# Patient Record
Sex: Male | Born: 1995 | Hispanic: Yes | Marital: Single
Health system: Midwestern US, Academic
[De-identification: ages and names within clinical notes are randomized; demographics above are authoritative.]

---

## 2019-02-21 ENCOUNTER — Encounter: Admit: 2019-02-21 | Discharge: 2019-02-21 | Payer: BC Managed Care – PPO

## 2019-02-21 ENCOUNTER — Encounter
Admit: 2019-02-21 | Discharge: 2019-02-21 | Payer: BC Managed Care – PPO | Primary: Student in an Organized Health Care Education/Training Program

## 2019-02-21 ENCOUNTER — Ambulatory Visit: Admit: 2019-02-21 | Discharge: 2019-02-22 | Payer: BC Managed Care – PPO

## 2019-02-21 NOTE — Telephone Encounter
02/21/19- received back from [facility] - they can not find this patient in their system- no records to send.  Thanks,  Ascencion Dike  [facility] Medical Records Specialist  [phone]

## 2019-02-21 NOTE — Progress Notes
Company:[employer] ( [employer])    Type: Looping    Duration: 30 DAYS    Ordering Provider: Vivianne Spence    Diagnons: PALPITATIONS    Online Enrollment Completed: Yes

## 2019-02-21 NOTE — Telephone Encounter
Pt's name was incorrect in our system. Correct last name was entered. New request faxed to [facility] for records at 4:35pm on 02/21/19.

## 2019-02-21 NOTE — Patient Instructions
We would like to follow up with you after testing is completed.     A heart rhythm monitor will be sent to your home with detailed instructions.     Please schedule a stress test. You can call [phone] to schedule.     Please call us with questions, [phone], Shawna Orleans RN or Cicero Duck RN     Please allow 5-7 business days on all results. All normal results will go to MyChart. If you do not have Mychart, it is strongly recommended to get this so you can easily view all your results. If you do not have mychart, we will attempt to call you once with normal lab and testing results. If we cannot reach you by phone with normal results, we will send you a letter. We will always call you with abnormal lab/test results.

## 2019-02-22 ENCOUNTER — Encounter: Admit: 2019-02-22 | Discharge: 2019-02-22 | Payer: BC Managed Care – PPO

## 2019-02-22 ENCOUNTER — Encounter
Admit: 2019-02-22 | Discharge: 2019-02-22 | Payer: BC Managed Care – PPO | Primary: Student in an Organized Health Care Education/Training Program

## 2019-02-22 DIAGNOSIS — R002 Palpitations: Principal | ICD-10-CM

## 2019-02-22 DIAGNOSIS — R55 Syncope and collapse: ICD-10-CM

## 2019-02-22 NOTE — Telephone Encounter
Pt with dobutamine echo scheduled for 5/14 at 0830.  Spoke with pt regarding npo status, covid-19 screen, wear a mask, car check-in, waiting room policy.  Verbalizes understanding.

## 2019-02-23 ENCOUNTER — Encounter: Admit: 2019-02-23 | Discharge: 2019-02-23 | Payer: BC Managed Care – PPO

## 2019-02-23 ENCOUNTER — Ambulatory Visit
Admit: 2019-02-23 | Discharge: 2019-02-23 | Payer: BC Managed Care – PPO | Primary: Student in an Organized Health Care Education/Training Program

## 2019-02-23 DIAGNOSIS — R002 Palpitations: Principal | ICD-10-CM

## 2019-02-23 DIAGNOSIS — R55 Syncope and collapse: Secondary | ICD-10-CM

## 2019-02-23 MED ORDER — SODIUM CHLORIDE 0.9 % IV SOLP
250 mL | INTRAVENOUS | 0 refills | Status: DC | PRN
Start: 2019-02-23 — End: 2019-02-28

## 2019-02-23 MED ORDER — DOBUTAMINE IN D5W 250 MG/250 ML (1 MG/ML) IV SOLP
40 ug/kg/min | Freq: Once | INTRAVENOUS | 0 refills | Status: CP
Start: 2019-02-23 — End: ?
  Administered 2019-02-23: 15:00:00 3176 ug/min via INTRAVENOUS

## 2019-02-23 MED ORDER — EUCALYPTUS-MENTHOL MM LOZG
1 | Freq: Once | ORAL | 0 refills | Status: AC | PRN
Start: 2019-02-23 — End: ?

## 2019-02-23 MED ORDER — METOPROLOL TARTRATE 5 MG/5 ML IV SOLN
5 mg | Freq: Once | INTRAVENOUS | 0 refills | Status: AC | PRN
Start: 2019-02-23 — End: ?

## 2019-02-23 MED ORDER — ATROPINE 0.1 MG/ML IJ SYRG
.25 mg | INTRAVENOUS | 0 refills | Status: DC | PRN
Start: 2019-02-23 — End: 2019-02-28
  Administered 2019-02-23: 15:00:00 0.25 mg via INTRAVENOUS

## 2019-02-24 ENCOUNTER — Encounter
Admit: 2019-02-24 | Discharge: 2019-02-24 | Payer: BC Managed Care – PPO | Primary: Student in an Organized Health Care Education/Training Program

## 2019-02-27 ENCOUNTER — Encounter
Admit: 2019-02-27 | Discharge: 2019-02-27 | Payer: BC Managed Care – PPO | Primary: Student in an Organized Health Care Education/Training Program

## 2019-02-27 DIAGNOSIS — R002 Palpitations: Principal | ICD-10-CM

## 2019-02-27 DIAGNOSIS — I472 Ventricular tachycardia: ICD-10-CM

## 2019-02-27 NOTE — Telephone Encounter
Fax received. Will have REG review. SR with VT 7 beasts at 134bpm.

## 2019-02-28 ENCOUNTER — Encounter: Admit: 2019-02-28 | Discharge: 2019-02-28 | Payer: BC Managed Care – PPO

## 2019-02-28 ENCOUNTER — Encounter
Admit: 2019-02-28 | Discharge: 2019-02-28 | Payer: BC Managed Care – PPO | Primary: Student in an Organized Health Care Education/Training Program

## 2019-02-28 DIAGNOSIS — R55 Syncope and collapse: Principal | ICD-10-CM

## 2019-03-01 ENCOUNTER — Encounter
Admit: 2019-03-01 | Discharge: 2019-03-01 | Payer: BC Managed Care – PPO | Primary: Student in an Organized Health Care Education/Training Program

## 2019-03-01 ENCOUNTER — Ambulatory Visit: Admit: 2019-03-01 | Discharge: 2019-03-02 | Payer: BC Managed Care – PPO

## 2019-03-01 ENCOUNTER — Encounter: Admit: 2019-03-01 | Discharge: 2019-03-01 | Payer: BC Managed Care – PPO

## 2019-03-01 ENCOUNTER — Ambulatory Visit
Admit: 2019-03-01 | Discharge: 2019-03-01 | Payer: BC Managed Care – PPO | Primary: Student in an Organized Health Care Education/Training Program

## 2019-03-01 DIAGNOSIS — R55 Syncope and collapse: Principal | ICD-10-CM

## 2019-03-01 LAB — COMPREHENSIVE METABOLIC PANEL
Lab: 0.7 mg/dL (ref 0.3–1.2)
Lab: 1 mg/dL (ref 0.4–1.24)
Lab: 10 K/UL (ref 3–12)
Lab: 10 mg/dL (ref 8.5–10.6)
Lab: 12 U/L (ref 7–56)
Lab: 13 U/L (ref 7–40)
Lab: 141 MMOL/L — ABNORMAL HIGH (ref 137–147)
Lab: 27 MMOL/L (ref 21–30)
Lab: 4.3 MMOL/L (ref 3.5–5.1)
Lab: 5.2 g/dL — ABNORMAL HIGH (ref 3.5–5.0)
Lab: 70 U/L (ref 25–110)
Lab: 8.1 g/dL — ABNORMAL HIGH (ref 6.0–8.0)
Lab: 87 mg/dL (ref 70–100)
Lab: >60 mL/min (ref >60)
Lab: >60 mL/min (ref >60)

## 2019-03-01 LAB — CBC AND DIFF
Lab: 0 10*3/uL (ref 0–0.20)
Lab: 3.8 10*3/uL — ABNORMAL LOW (ref 4.5–11.0)
Lab: 5.3 M/UL (ref 4.4–5.5)

## 2019-03-01 LAB — MAGNESIUM: Lab: 2.1 mg/dL (ref 1.6–2.6)

## 2019-03-01 LAB — BNP (B-TYPE NATRIURETIC PEPTI): Lab: 12 pg/mL (ref 0–100)

## 2019-03-01 LAB — TSH WITH FREE T4 REFLEX: Lab: 2.5 uU/mL (ref 0.35–5.00)

## 2019-03-01 MED ORDER — METOPROLOL TARTRATE 25 MG PO TAB
12.5 mg | ORAL_TABLET | Freq: Two times a day (BID) | ORAL | 11 refills | 90.00000 days | Status: DC
Start: 2019-03-01 — End: 2019-05-31

## 2019-03-02 ENCOUNTER — Encounter
Admit: 2019-03-02 | Discharge: 2019-03-02 | Payer: BC Managed Care – PPO | Primary: Student in an Organized Health Care Education/Training Program

## 2019-03-02 DIAGNOSIS — I472 Ventricular tachycardia: ICD-10-CM

## 2019-03-02 DIAGNOSIS — R002 Palpitations: Principal | ICD-10-CM

## 2019-03-09 ENCOUNTER — Encounter
Admit: 2019-03-09 | Discharge: 2019-03-09 | Payer: BC Managed Care – PPO | Primary: Student in an Organized Health Care Education/Training Program

## 2019-03-09 ENCOUNTER — Ambulatory Visit: Admit: 2019-03-09 | Discharge: 2019-03-09 | Payer: BC Managed Care – PPO

## 2019-03-09 DIAGNOSIS — R002 Palpitations: Principal | ICD-10-CM

## 2019-03-09 DIAGNOSIS — I472 Ventricular tachycardia: ICD-10-CM

## 2019-03-09 MED ORDER — SODIUM CHLORIDE 0.9 % IJ SOLN
25 mL | Freq: Once | INTRAVENOUS | 0 refills | Status: CP
Start: 2019-03-09 — End: ?
  Administered 2019-03-09: 15:00:00 25 mL via INTRAVENOUS

## 2019-03-09 MED ORDER — GADOBENATE DIMEGLUMINE 529 MG/ML (0.1MMOL/0.2ML) IV SOLN
15 mL | Freq: Once | INTRAVENOUS | 0 refills | Status: CP
Start: 2019-03-09 — End: ?
  Administered 2019-03-09: 15:00:00 15 mL via INTRAVENOUS

## 2019-03-10 ENCOUNTER — Encounter
Admit: 2019-03-10 | Discharge: 2019-03-10 | Payer: BC Managed Care – PPO | Primary: Student in an Organized Health Care Education/Training Program

## 2019-03-12 ENCOUNTER — Emergency Department
Admit: 2019-03-12 | Discharge: 2019-03-12 | Payer: BC Managed Care – PPO | Attending: Student in an Organized Health Care Education/Training Program

## 2019-03-12 ENCOUNTER — Encounter
Admit: 2019-03-12 | Discharge: 2019-03-12 | Payer: BC Managed Care – PPO | Primary: Student in an Organized Health Care Education/Training Program

## 2019-03-12 ENCOUNTER — Emergency Department
Admit: 2019-03-12 | Discharge: 2019-03-13 | Disposition: A | Discharge status: Home / Self Care | Payer: BC Managed Care – PPO | Attending: Student in an Organized Health Care Education/Training Program

## 2019-03-12 DIAGNOSIS — R55 Syncope and collapse: Principal | ICD-10-CM

## 2019-03-12 MED ORDER — ASPIRIN 81 MG PO CHEW
324 mg | Freq: Once | ORAL | 0 refills | Status: CP
Start: 2019-03-12 — End: ?
  Administered 2019-03-13: 324 mg via ORAL

## 2019-03-12 MED ORDER — FAMOTIDINE (PF) 20 MG/2 ML IV SOLN
20 mg | Freq: Once | INTRAVENOUS | 0 refills | Status: CP
Start: 2019-03-12 — End: ?
  Administered 2019-03-13: 20 mg via INTRAVENOUS

## 2019-03-12 MED ORDER — FAMOTIDINE 20 MG PO TAB
20 mg | ORAL_TABLET | Freq: Two times a day (BID) | ORAL | 0 refills | 90.00000 days | Status: DC
Start: 2019-03-12 — End: 2019-06-12

## 2019-03-12 MED ORDER — ACETAMINOPHEN/LIDOCAINE/ANTACID DS(#) 1:1:3  PO SUSP
30 mL | Freq: Once | ORAL | 0 refills | Status: CP
Start: 2019-03-12 — End: ?
  Administered 2019-03-13: 30 mL via ORAL

## 2019-03-12 NOTE — ED Notes
Jason Elliott is a 23 y.o male presenting to ED 17 with CC of chest pain for over a month. Pt reports has been seen for his CP in the past and is currently wearing a holter monitor. [name] has a past medical history of Near syncope (02/28/2019).  Patient is alert and oriented x4. Follows commands. Moves all extremities equally. Telemetry, bp, and SPO2 monitor applied. Afebrile. Patient is sinus rhythm. BP WNL. Palpable pulses. SPO2 WNL on room air. Lungs clear to auscultation. Eupneic. Skin warm/dry/intact. Call light placed within reach. Cart in lowest position, wheels locked, side rails up. Will continue to monitor and await provider evaluation.     All belongings gathered and placed in belonging bag with patient labels at bedside.  The bag(s) contain(s) the following:  Clothing: shirt, shorts, shoes, mask  Other: wallet; phone; watch  All belongings placed in 1 bag(s).  Belongings disposition: all with patient at bedside.

## 2019-03-13 DIAGNOSIS — R079 Chest pain, unspecified: Principal | ICD-10-CM

## 2019-03-13 LAB — COMPREHENSIVE METABOLIC PANEL
Lab: 1.1 mg/dL (ref 0.4–1.24)
Lab: 103 MMOL/L (ref 98–110)
Lab: 13 mg/dL (ref 7–25)
Lab: 140 MMOL/L (ref 137–147)
Lab: 3.8 MMOL/L — ABNORMAL HIGH (ref 3.5–5.1)
Lab: 7.5 g/dL (ref 6.0–8.0)
Lab: 9.5 mg/dL (ref 8.5–10.6)
Lab: 97 mg/dL (ref 70–100)
Lab: >60 mL/min (ref >60)

## 2019-03-13 LAB — POC TROPONIN
Lab: 0 ng/mL (ref 0.00–0.05)
Lab: 0 ng/mL (ref 0.00–0.05)

## 2019-03-13 LAB — CBC AND DIFF
Lab: 0 10*3/uL (ref 0–0.20)
Lab: 0.1 10*3/uL (ref 0–0.45)
Lab: 4.1 10*3/uL — ABNORMAL LOW (ref 4.5–11.0)

## 2019-03-13 NOTE — ED Notes
Pt identified. Pt a&o x 4, breathing even, non-labored. Skin race appropriate, warm and dry. Pt on cardiac monitor, VSS. Bed in lowest locked position, call light within reach.

## 2019-03-13 NOTE — ED Notes
This RN provides discharge teaching and instructions. Pt AAOx4, verbalizes understanding and denies further questions. Pt ambulates off unit with an even, steady gait in no acute distress. All belongings and paperwork in patient possession upon departure.

## 2019-03-14 ENCOUNTER — Encounter: Admit: 2019-03-14 | Discharge: 2019-03-14 | Primary: Student in an Organized Health Care Education/Training Program

## 2019-03-14 ENCOUNTER — Emergency Department: Admit: 2019-03-14 | Discharge: 2019-03-14 | Disposition: A | Discharge status: Home / Self Care

## 2019-03-14 ENCOUNTER — Emergency Department: Admit: 2019-03-14 | Discharge: 2019-03-14

## 2019-03-14 DIAGNOSIS — R079 Chest pain, unspecified: Principal | ICD-10-CM

## 2019-03-14 DIAGNOSIS — R55 Syncope and collapse: Secondary | ICD-10-CM

## 2019-03-14 LAB — COMPREHENSIVE METABOLIC PANEL
Lab: 0.9 mg/dL (ref 0.3–1.2)
Lab: 1 mg/dL (ref 0.4–1.24)
Lab: 102 MMOL/L (ref 98–110)
Lab: 12 U/L (ref 7–56)
Lab: 14 U/L (ref 7–40)
Lab: 141 MMOL/L (ref 137–147)
Lab: 22 mg/dL (ref 7–25)
Lab: 29 MMOL/L (ref 21–30)
Lab: 3.9 MMOL/L (ref 3.5–5.1)
Lab: 5.2 g/dL — ABNORMAL HIGH (ref 3.5–5.0)
Lab: 61 U/L (ref 25–110)
Lab: 83 mg/dL (ref 70–100)
Lab: 9.9 mg/dL (ref 8.5–10.6)
Lab: >60 mL/min (ref >60)
Lab: >60 mL/min (ref >60)
Lab: 10 (ref 3–12)

## 2019-03-14 LAB — POC TROPONIN: Lab: 0 ng/mL (ref 0.00–0.05)

## 2019-03-14 LAB — CBC: Lab: 3.9 10*3/uL — ABNORMAL LOW (ref 4.5–11.0)

## 2019-03-14 NOTE — ED Notes
Upon receiving discharge paperwork pt states "my chest isn't really feeling better, I think it might actually be worse. Is there anything I can get for my pain to go home with?" MD notified.

## 2019-03-14 NOTE — Telephone Encounter
Called patient.  LVM for return call.

## 2019-03-14 NOTE — ED Notes
Pt A&O x4, VSS, with no complaints. Pt given discharge instructions. Pt verbalizes understanding of discharge instructions. Pt states no further questions or comments at this point in time.

## 2019-03-14 NOTE — Telephone Encounter
-----   Message from Golda Acre, LPN sent at 10/17/8370 11:14 AM CDT -----  Regarding: RRR- chest pain and SOA  VM from patient on triage line.  He was in our ED on 03-12-19 for chest pain.  They did chest x-ray and EKG and was told that he was fine.  He is having chest pain, a sharp pain across his shoulder and feet are swollen, SOA at times, feels like b/p is high as his cheeks are red and his legs are swollen.  He would like call back at #[phone] and he appreciates any help we can give him.

## 2019-03-17 ENCOUNTER — Encounter: Admit: 2019-03-17 | Discharge: 2019-03-17 | Primary: Student in an Organized Health Care Education/Training Program

## 2019-03-17 ENCOUNTER — Ambulatory Visit: Admit: 2019-03-17 | Discharge: 2019-03-18 | Primary: Student in an Organized Health Care Education/Training Program

## 2019-03-17 DIAGNOSIS — R55 Syncope and collapse: Secondary | ICD-10-CM

## 2019-03-17 DIAGNOSIS — F419 Anxiety disorder, unspecified: Secondary | ICD-10-CM

## 2019-03-17 DIAGNOSIS — I472 Ventricular tachycardia: Secondary | ICD-10-CM

## 2019-03-17 DIAGNOSIS — R002 Palpitations: Secondary | ICD-10-CM

## 2019-03-17 DIAGNOSIS — R0789 Other chest pain: Secondary | ICD-10-CM

## 2019-03-17 MED ORDER — OMEPRAZOLE 20 MG PO CPDR
20 mg | ORAL_CAPSULE | Freq: Every day | ORAL | 0 refills | Status: DC
Start: 2019-03-17 — End: 2019-03-28

## 2019-03-17 NOTE — Progress Notes
Date of Service: 03/17/2019    Jason Cones Jamara Bombara. is a 23 y.o. male.                History of Present Illness:     See Medical History/Course Below  Jason Elliott. is a 23 y.o. man referred by Reubin Milan, MD for evaluation of non sustained VT on event monitor.  No known cardiac history. He was recently seen by my partner Dr. Vivianne Spence on 02/21/2019 at that time he had reported feeling skips and flutters for over a year.  These have become more frequently recently and the symptoms associated with these have become more severe. Cardiac MRI dated 03/09/2019 showed a normal ventricular EF at 66%, normal right ventricular size and function, no significant valvular normalities, and normal chamber sizes.  There is no evidence of inflammation or fibrosis to explain the patient's ventricular arrhythmias.  A dobutamine stress echocardiogram dated 02/23/2019 showed a normal left ventricular systolic function of 60%, no significant valvular abnormalities, no evidence of ischemia.  There were no arrhythmias inducible during the duration of the study.I reviewed the urgent rhythm strip submitted on 02/27/2019 and I am not convinced that the episode of nonsustained ventricular tachycardia is real.  This could be artifact as there is some sort of abrupt termination and transition prior to the last beat of what is thought to be a premature ventricular contraction.  We will need to continue to monitor and see if this recurs throughout the duration of the statin.  His current cardiac work-up is been negative to date.  With nothing to explain why he would be having nonsustained ventricular tachycardia.  He recently presented to the emergency department on 03/12/2019 with symptoms of chest pain x2 weeks.  He had also reported mild nausea and vomiting, lightheadedness, and palpitations that were worse after he had eaten.  Cardiac work-up was negative.  There is no evidence of significant arrhythmias.  He again presented to the emergency department on 03/14/2019 with similar complaints to 03/12/2019.  We were able to obtain the rhythm strips from the event monitor correlating with the patient's episode at 0728 on 03/14/2019 where he reported chest pain and skipped beats these  episodes did not correlate with any specific arrhythmia.  There was no evidence of nonsustained ventricular tachycardia. Additionally, he obtained the rhythm strips from May 29 during an episode of skipped beats lightheadedness and heart racing and this was associated with sinus rhythm one isolated premature ventricular contraction and one isolated premature atrial contraction.Marland Kitchen    He is with no acute complaints today.  He is has dull chest discomfort today. He felt his blood pressure was rising, he was tired, and he felt flushed again. Both episodes were the same when the episodes happened on Wednesday.  He reports that he is having sharp pain in the chest that sharp pain can last seconds to all day.  Stress does play a big part of this. Anxiety, has always been a problem. He has also noticed that with some foods he gets more this. He has had some nausea, vomiting. Certain foods that cause his symptoms such as pizza or soda.  He has pain with tuna and other foods.  The boosts are something that he uses as an alternative when he can't eat due to pain. He has had a lot of dryness in his throat. Sometimes he has some swelling where it is rough to go down. He has had some issues with  food getting stuck in his throats.     He does not report any significant stressors.  He lives with his fianc???e and his relationship is been going okay.  His mother and father live in [place] and he has a close relationship with them as well.  He has a close relationship with his brothers with no specific contacts currently.  He does report that he is not completely satisfied with his current job and has some issues with the fact that he is not doing what he would ultimately like to be.  He denies being depressed although he feels that he is frequently anxious and has always been like this all of his life.  He denies feeling of hurting himself or hurting others.    Impression:   #.  Chest pain: First, I reviewed the results of that 7 beat run of nonsustained ventricular tachycardia dated 02/27/2019 that correlated with the urgent alert from vital.  On my close review I am confident that this is not true ventricular tachycardia.  This is most consistent with artifact as there is an appearance of QRS spikes that can be seen throughout the VT there is also an abrupt termination of the rhythm strip with some sort of interference.  Ultimately I believe this was this was misinterpreted.  I would like to follow-up on the final results of the monitor. As noted above, I reviewed the rhythm strips from the days that he presented to the emergency department and these were consistent with sinus tachycardia and no nonsustained ventricular tachycardia.  There was one premature atrial contraction and one premature ventricular contraction noted. I am having a hard time correlating the patient's chest discomfort with a cardiac etiology.  Description of his symptoms today actually most consistent with a GI etiology potentially an esophageal spasm, and upper epigastric issue, and/or an achalasia potentially.  He has some occasional food stuck sensation in his throat and some dryness in his throat that does cause him discomfort.  He is also having some issues with specific foods that cause him to have more of the pain and discomfort.  I will go ahead and refer him to GI for formal evaluation.  However, I would like for him to establish care with a primary care doctor as he has several generalized issues and may warrant an endocrine evaluation (rule out pheo, carcinoid syndrome) and may need something for a mild depression/anxiety disorder as he reports several symptoms that suggest some component of these.   #.  Palpitations: He has not had any recurrent episodes of palpitations.  The episode he had that initially brought him into see a cardiologist actually occurred before the monitor.  He has not had a recurrent episode while he has been on the monitor.  #.  Anxiety: There is certainly some component of anxiety related to the patient's symptoms.  I  will have him establish care with a primary care provider for these issues.  #.  Lower extremity edema: Vague and nonspecific symptoms regarding his lower extremity edema.  I have not seen any lower extremity edema during his office visit.    Plan:   #.  I will go ahead and refer him  to GI to rule out any esophageal pathology that might be explaining the patient's sharp chest discomfort and his correlation with specific foods in diet.  #.  We will go ahead and start omeprazole 20 mg once daily to see if this helps.  #.  I would  like him to establish care with a primary care physician to help work-up any potential endocrine issues as noted above and to assist with some potential underlying depression/anxiety disorder.  #.  I provided significant reassurance to the patient regarding his negative cardiac work-up.  I will await the results of the monitor and will plan to see him back in 6 months.  If everything continues to be negative at that point I will have him continue to follow-up with his primary care provider.    Return to clinic in 6 months.    Jason Childes, MD  Cardiovascular Electrophysiology    Please call us with any questions or concerns at [phone].    Medication List:     ??? famotidine (PEPCID) 20 mg tablet Take one tablet by mouth twice daily. (Patient taking differently: Take 20 mg by mouth twice daily. Indications: Pt taking once per day)   ??? metoprolol tartrate (LOPRESSOR) 25 mg tablet Take one-half tablet by mouth twice daily. ??? omeprazole DR (PRILOSEC) 20 mg capsule Take one capsule by mouth daily before breakfast.            Social History:     Social History     Socioeconomic History   ??? Marital status: Single     Spouse name: Not on file   ??? Number of children: Not on file   ??? Years of education: Not on file   ??? Highest education level: Not on file   Occupational History   ??? Not on file   Tobacco Use   ??? Smoking status: Never Smoker   ??? Smokeless tobacco: Never Used   Substance and Sexual Activity   ??? Alcohol use: Not Currently     Comment: social   ??? Drug use: Never   ??? Sexual activity: Not on file   Other Topics Concern   ??? Not on file   Social History Narrative   ??? Not on file       Family History:   No family history of premature coronary artery disease or cardiomyopathy.    Pertinent Studies:       Echo Results  (Last 3 results in the past 3 years)    Echo EF LVIDD LA Size IVS LVPW Rest PAP    (02/23/19)  60 (02/23/19)  4.39 (02/23/19)  2.93 (02/23/19)  1.06 (02/23/19)  1.02 (02/23/19)  22          ECG (personal interpretation)   EKG dated 03/17/2019 shows a sinus rhythm at a rate of 69 bpm, PR interval 149 ms, QRS duration 106 ms, QTC of 426 ms.  There are nonspecific T wave abnormalities.  100 EKG shows some premature atrial contractions or sinus arrhythmia.  There is left atrial enlargement.  On limited resources my friend    Vitals:    03/17/19 1113   BP: 122/74   BP Source: Arm, Left Upper   Pulse: 70   SpO2: 98%   Weight: 71.4 kg (157 lb 6.4 oz)   Height: 1.702 m (5' 7)   PainSc: Five     Body mass index is 24.65 kg/m???.     Past Medical History  Patient Active Problem List    Diagnosis Date Noted   ??? Non-cardiac chest pain 03/23/2019   ??? Anxiety 03/23/2019   ??? Nonsustained ventricular tachycardia (HCC) 03/01/2019     03/09/2019 - Cardiac MRI:  Normal left ventricular size, wall thickness with normal calculated left ventricular ejection fraction of ???66%.  Normal right ventricular size and normal right ventricular systolic function.  No clinically significant valvular abnormalities were noted.  Normal biatrial sizes.  On delayed gadolinium enhancement imaging, no late gadolinium enhancement was seen suggestive of ischemic, inflammatory or focal infiltrative disease process.  No evidence of thoracic aortic and aneurysm, coarctation, or dissection.     ??? Near syncope 02/28/2019   ??? Palpitations 02/21/2019     02/23/2019 - Dobutamine Stress Echo:  Left ventricular systolic function is within normal limits.  LVEF 60%.  No significant valvular abnormalities.  No pericardial effusion.  Nonischemic dobutamine stress echocardiogram and electrocardiogram.            ROS  Review of Systems   Constitution: Positive for malaise/fatigue.   HENT: Negative.    Eyes: Negative.    Cardiovascular: Positive for chest pain.   Respiratory: Negative.    Endocrine: Negative.    Hematologic/Lymphatic: Negative.    Skin: Negative.    Musculoskeletal: Negative.    Gastrointestinal: Negative.    Genitourinary: Negative.    Neurological: Negative.    Psychiatric/Behavioral: Negative.    Allergic/Immunologic: Negative.    ???  BP 122/74 (BP Source: Arm, Left Upper)  - Pulse 70  - Ht 1.702 m (5' 7)  - Wt 71.4 kg (157 lb 6.4 oz)  - SpO2 98%  - BMI 24.65 kg/m???   GENERAL APPEARANCE: Patient in no apparent distress.  PSYCH: Flat affect, depressed, soft-spoken  NEURO: Alert and conversant  VESSELS: JVD is not elevated above the sternal notch.  ENT: Moist mucous membranes  CARDIOVASCULAR:   No parasternal lift Regular rhythm and normal rate. S1 and S2 present.  No murmur.  There is no pericardial rub  GI: Abdomen  nondistended No appreciable hepatomegaly in the upright position  RESPIRATORY: Clear breath sounds bilaterally  EXTREMITIES: There was no    lower extremity edema   SKIN: No rash on chest  GAIT: Able to ambulate to the exam table     Problems Addressed Today  Encounter Diagnoses   Name Primary? ??? Near syncope Yes   ??? Anxiety    ??? Palpitations    ??? Nonsustained ventricular tachycardia (HCC)    ??? Non-cardiac chest pain

## 2019-03-28 ENCOUNTER — Encounter: Admit: 2019-03-28 | Discharge: 2019-03-28 | Primary: Student in an Organized Health Care Education/Training Program

## 2019-03-28 DIAGNOSIS — R55 Syncope and collapse: Secondary | ICD-10-CM

## 2019-03-28 MED ORDER — OMEPRAZOLE 20 MG PO CPDR
20 mg | ORAL_CAPSULE | Freq: Every day | ORAL | 0 refills | Status: DC
Start: 2019-03-28 — End: 2019-05-17

## 2019-03-28 NOTE — Telephone Encounter
-----   Message from Paulino Rily, RN sent at 03/17/2019 12:27 PM CDT -----  Regarding: [id number] symptom assessment f/u  Has pt called to update [place] on his symptoms?   Pt started on Omeprazole 03/17/19. Pt instructed to call on day 10 to notify us if symptoms are improving or not with taking Omeprazole. If so then please refill  Omeprazole. Thanks!

## 2019-03-28 NOTE — Telephone Encounter
This RN attempted to contact pt in regards to symptom assessment after starting Omeprazole on 03/17/19. RN also attempted to f/u on GI referral and IM referral. No answer, LVM with call back number.

## 2019-03-29 ENCOUNTER — Encounter: Admit: 2019-03-29 | Discharge: 2019-03-29 | Primary: Student in an Organized Health Care Education/Training Program

## 2019-03-29 ENCOUNTER — Ambulatory Visit: Admit: 2019-03-28 | Discharge: 2019-03-29 | Primary: Student in an Organized Health Care Education/Training Program

## 2019-03-29 DIAGNOSIS — R002 Palpitations: Secondary | ICD-10-CM

## 2019-04-19 ENCOUNTER — Encounter: Admit: 2019-04-19 | Discharge: 2019-04-19 | Primary: Student in an Organized Health Care Education/Training Program

## 2019-04-19 ENCOUNTER — Emergency Department: Admit: 2019-04-19 | Discharge: 2019-04-19 | Disposition: A | Discharge status: Home Health

## 2019-04-19 ENCOUNTER — Emergency Department: Admit: 2019-04-19 | Discharge: 2019-04-19

## 2019-04-19 DIAGNOSIS — R42 Dizziness and giddiness: Secondary | ICD-10-CM

## 2019-04-19 DIAGNOSIS — Z1159 Encounter for screening for other viral diseases: Secondary | ICD-10-CM

## 2019-04-19 DIAGNOSIS — R002 Palpitations: Secondary | ICD-10-CM

## 2019-04-19 DIAGNOSIS — R079 Chest pain, unspecified: Principal | ICD-10-CM

## 2019-04-19 LAB — COMPREHENSIVE METABOLIC PANEL
Lab: 0.8 mg/dL (ref 0.3–1.2)
Lab: 1 mg/dL (ref 0.4–1.24)
Lab: 105 MMOL/L (ref 98–110)
Lab: 14 U/L (ref 7–40)
Lab: 18 mg/dL (ref 7–25)
Lab: 26 MMOL/L (ref 21–30)
Lab: 4 MMOL/L (ref 3.5–5.1)
Lab: 9.6 mg/dL (ref 8.5–10.6)

## 2019-04-19 LAB — CBC AND DIFF
Lab: 0 10*3/uL (ref 0–0.20)
Lab: 0.1 10*3/uL (ref 0–0.45)
Lab: 0.2 K/UL (ref >60)
Lab: 1 % (ref 0–2)
Lab: 1.4 K/UL (ref >60)
Lab: 2 K/UL (ref 1.8–7.0)
Lab: 3.9 10*3/uL — ABNORMAL LOW (ref 4.5–11.0)
Lab: 36 % (ref 24–44)
Lab: 4.7 M/UL (ref 4.4–5.5)
Lab: 53 % (ref 41–77)
Lab: 91 FL (ref 80–100)

## 2019-04-19 LAB — D-DIMER: Lab: <21 ng{FEU}/mL (ref <500)

## 2019-04-19 LAB — MONO SPOT: Lab: NEGATIVE 10*3/uL (ref 150–400)

## 2019-04-19 LAB — CREATINE KINASE-CPK: Lab: 103 U/L (ref 35–232)

## 2019-04-19 LAB — POC TROPONIN: Lab: 0 ng/mL (ref 0.00–0.05)

## 2019-04-19 MED ORDER — HYOSCYAMINE SULFATE 0.125 MG PO TBDI
.125 mg | Freq: Once | SUBLINGUAL | 0 refills | Status: CP
Start: 2019-04-19 — End: ?
  Administered 2019-04-19: 22:00:00 0.125 mg via SUBLINGUAL

## 2019-04-19 MED ORDER — SODIUM CHLORIDE 0.9 % IV SOLP
1000 mL | INTRAVENOUS | 0 refills | Status: CP
Start: 2019-04-19 — End: ?
  Administered 2019-04-19: 20:00:00 1000 mL via INTRAVENOUS

## 2019-04-19 NOTE — ED Notes
Pt presents to the ED today for heart palpitations. Pt was at work today when he had another "episode" and felt his heart fluttering and SOA. Pt states this has been going on for the last few months but he has never been diagnosed with anything. Pt is 100% on RA and HR is 77. Pt states he is in 7/10 pain in his chest that he describes as sore. Pt also states he feels like his right ankle has been swelling up. Pt A&Ox4 resting in bed, respirations even and non-labored. Skin is warm, dry, intact, and appropriate for ethnicity. Pt hooked up to monitors. Bed in lowest locked position, call light within reach.

## 2019-04-19 NOTE — ED Notes
Pt A&Ox4 and given discharge instructions and follow up information. Pt verbalizes understanding and has no further questions or complaints. VSS. Pt ambulated out of ED with steady gait to go home by POV.

## 2019-04-20 ENCOUNTER — Encounter: Admit: 2019-04-20 | Discharge: 2019-04-20 | Primary: Student in an Organized Health Care Education/Training Program

## 2019-04-20 LAB — COVID-19 (SARS-COV-2) PCR

## 2019-04-20 NOTE — Telephone Encounter
Patient returned call to [facility]- patient verified full name and date of birth. Informed patient of NEGATIVE COVID 19 testing. Patient was asked if they were still experiencing any symptoms and responded with chest pain/soreness. Patient was advised to contact PCP, specialist, and/or ordering provider for ongoing symptoms. Patient does not have PCP- provided patient with [place] line phone number. Patient had no further questions.      Crissie Sickles, DNP, RN, [place], [place], CNRN

## 2019-05-17 ENCOUNTER — Encounter: Admit: 2019-05-17 | Discharge: 2019-05-17 | Primary: Student in an Organized Health Care Education/Training Program

## 2019-05-17 ENCOUNTER — Ambulatory Visit: Admit: 2019-05-17 | Discharge: 2019-05-17 | Primary: Student in an Organized Health Care Education/Training Program

## 2019-05-17 DIAGNOSIS — R55 Syncope and collapse: Secondary | ICD-10-CM

## 2019-05-17 DIAGNOSIS — R002 Palpitations: Secondary | ICD-10-CM

## 2019-05-17 DIAGNOSIS — R0789 Other chest pain: Secondary | ICD-10-CM

## 2019-05-17 DIAGNOSIS — R634 Abnormal weight loss: Secondary | ICD-10-CM

## 2019-05-17 DIAGNOSIS — Z1159 Encounter for screening for other viral diseases: Secondary | ICD-10-CM

## 2019-05-17 DIAGNOSIS — Z0000 Encounter for general adult medical examination without abnormal findings: Secondary | ICD-10-CM

## 2019-05-17 DIAGNOSIS — K219 Gastro-esophageal reflux disease without esophagitis: Secondary | ICD-10-CM

## 2019-05-17 MED ORDER — OMEPRAZOLE 20 MG PO CPDR
ORAL_CAPSULE | Freq: Every day | ORAL | 3 refills | Status: DC
Start: 2019-05-17 — End: 2019-05-31

## 2019-05-17 NOTE — Progress Notes
General Medicine Clinic      SUBJECTIVE      History of Present Illness:  23 year old man with a history of palpitations, anxiety who presents to establish care.    He comes to [place] after just being seen in gastroenterology clinic for dysphasia, having been referred to them by cardiology.  His history begins earlier this year May 2020 when he had an episode of what he describes all of his extremities when cold and he felt flushed and sweaty.  He went to [facility] and was subsequently seen in cardiology clinic due to palpitations. Cardiac work-up for his chest pain/palpitations has been extensive, he has seen Drs. Genton (Cardiology) and Derrell Lolling (EP). He has undergone a Cardiac MRI and dobutamine stress echo which were both unremarkable.    He notes that over the past 3 to 4 months he has been having daily night sweats, had a 30 pound weight loss, he has changed his diet somewhat but does not believe it is enough to explain his weight loss, he is also feeling fatigued and generally rundown.  He works in Teaching laboratory technician, he denies being exposed to benzene or any other chemicals however he is not entirely sure of this.  His paternal grandfather had leukemia which she died of, no other family history.  He has not had any new lumps or bumps especially testicular.  He is sexually active with only 1 partner, his fianc???e and has never had a history of sexually transmitted fractions.  He has 2 tattoos one on his left arm and one on his right upper chest which were professionally done.    Previous work-up is notable for a normal d-dimer, negative mono-spot, negative SARS-CoV-2, normal TSH, normal CMP, transient erythrocytosis, persistent leukopenia (~3.9).      Review of Systems   Constitutional: Positive for malaise/fatigue and weight loss. Negative for fever.        Night sweats   HENT: Negative for congestion and sore throat.    Eyes: Negative for pain. Respiratory: Negative for cough and shortness of breath.    Cardiovascular: Negative for chest pain and leg swelling.   Gastrointestinal: Negative for diarrhea, nausea and vomiting.   Genitourinary: Negative for dysuria.   Skin: Negative for rash.   Neurological: Negative for weakness.         Medical History:   Diagnosis Date   ??? Near syncope 02/28/2019       History reviewed. No pertinent surgical history.    History reviewed. No pertinent family history.    Social History     Socioeconomic History   ??? Marital status: Single     Spouse name: Not on file   ??? Number of children: Not on file   ??? Years of education: Not on file   ??? Highest education level: Not on file   Occupational History   ??? Not on file   Tobacco Use   ??? Smoking status: Never Smoker   ??? Smokeless tobacco: Never Used   Substance and Sexual Activity   ??? Alcohol use: Not Currently     Comment: social   ??? Drug use: Never   ??? Sexual activity: Not on file   Other Topics Concern   ??? Not on file   Social History Narrative   ??? Not on file       No Known Allergies      Medications:  ??? famotidine (PEPCID) 20 mg tablet Take one tablet by mouth twice daily. (  Patient taking differently: Take 20 mg by mouth twice daily. Indications: Pt taking once per day)   ??? metoprolol tartrate (LOPRESSOR) 25 mg tablet Take one-half tablet by mouth twice daily.   ??? omeprazole DR (PRILOSEC) 20 mg capsule Take two capsules by mouth daily before breakfast AND one capsule daily before dinner.             OBJECTIVE      Vitals:    05/17/19 0935   BP: 120/78   Pulse: 58   Resp: 16   Temp: 36.7 ???C (98 ???F)   TempSrc: Oral   Weight: 65.3 kg (144 lb)   Height: 170.2 cm (67)   Body mass index is 22.55 kg/m???.      Physical Exam  Constitutional:       General: He is not in acute distress.  Cardiovascular:      Rate and Rhythm: Normal rate and regular rhythm.   Pulmonary:      Effort: Pulmonary effort is normal. No respiratory distress.   Abdominal:      Palpations: Abdomen is soft. Tenderness: There is no abdominal tenderness.   Genitourinary:     Scrotum/Testes: Normal.   Musculoskeletal:      Right lower leg: No edema.      Left lower leg: No edema.   Lymphadenopathy:      Cervical: No cervical adenopathy.   Skin:     General: Skin is warm and dry.      Capillary Refill: Capillary refill takes less than 2 seconds.   Neurological:      General: No focal deficit present.      Mental Status: He is alert and oriented to person, place, and time.         Laboratory:  Persistent leukopenia            ASSESSMENT & PLAN      Problem   Unintentional weight loss & night sweats    Over 3 months lost 30 lbs, possible dietary influence but possibly not enough to explain, possible psychiatric influence (h.o. anxiety, somatic symptoms/pain)  Associated fatigue, daily night sweats  Note persistent leukopenia and dysphagia (recent visit with [facility])  Note monospot neg    CBC, smear, ESR-CRP, CT CAP     Preventative Health Care    HIV and HepC today    Nonsmoker    No EtOH               Patient Instructions   [name], I want to review what we spoke about and the plan moving forward:    Ordered Tests:  ??? CT chest, abdomen, pelvis  ??? Multiple blood tests            Important information:  Scheduling ([phone]) or ([phone])  Nurse [phone])  Clinic Fax [phone])    Emergencies (911)  Urgent after-hour [phone]), ask to page on-call physician        Return in about 2 months (around 07/17/2019).

## 2019-05-17 NOTE — Patient Instructions
1) You will be contacted to by Endoscopy Scheduling for date/time of EGD. Please ensure to follow prep instructions listed below:      *You are required to complete a COVID swab within 72 hours prior to procedure. Further information will be provided at time of scheduling. If you would like to complete locally, please advise RN of facility location to ensure orders are sent and received within a timely manner.     2) Ensure to take PPI medication as discussed and 30 minutes prior to meal.     Please call for any questions. Maralyn Sago, RN [phone].   For up to date information on the COVID-19 virus, visit the [facility] website. [email or web address]  ??? General supportive care during cold and flu season and infection prevention reminders:    o Wash hands often with soap and water for at least 20 seconds   o Cover your mouth and nose   o Social distancing: try to maintain 6 feet between you and other people   o Stay home if sick and symptoms mild or manageable?  ??? If you must be around people wear a mask    ??? If you are having symptoms of a lower respiratory infection (cough, shortness of breath) and/or fever AND either traveled in last 30 days (internationally or to region of exposure) OR known exposure to patient with COVID19:     o Call your primary care provider for questions or health needs.   ??? Tell your doctor about your recent travel and your symptoms     o In a medical emergency, call 911 or go to the nearest emergency room.        EGD (ESOPHAGOGASTRODUODENOSCOPY) PREP  PATIENT NAME:__________________________??? ???Dr. ______________________  Bonita Quin are scheduled for an EGD on:??? ???______________________ ???at??? ???__________________??? ??? ??? ??? ???  You must arrive by:???________________ ???and check in at ADMISSIONS[facility]SURGERY CENTER.    Upper GI endoscopy allows healthcare providers to look directly into the beginning of your gastrointestinal(GI) tract.??? The esophagus, stomach, and duodenum (first part of the small intestine) make up the upper GI tract.???  5 Days Prior to Exam:  2. Check with your prescribing physician for instructions about stopping your blood thinner.Examples of blood thinners are Aleve, Aspirin, Coumadin, Eliquis, Ibuprofen, Naproxen, Plavix, and Xarelto.  3. Do not give yourself a Lovenox injection the morning of the test. Lovenox injections may be taken as usual through the day before your test.  ??????Day of Exam:  2. No solid food after midnight. You may drink clear liquids only up until 4 hours before your scheduled procedure time.After this, you should have nothing by mouth.??? This includes GUM or CANDY.???    ???  ??? 1. Chewing tobacco must be stopped??? 6 hours before your scheduled procedure.  2. If you have an early morning test, take ONLY your essential morning medications (heart, blood pressure, seizure, etc.) with a small sip of water.  3. You will be sedated for the procedure. A responsible adult must drive you home (no Benedetto Goad, taxis, or buses are permitted). If you do not have a driver we will be unable to do the test.  4. You will be here for 3-4 hours from arrival time.  5. You will not be able to return to work the same day.  6. Please bring a list of your current medications and the dosages with you.     The Procedure:  ??? You will lie on the endoscopy table. Usually patients lie on the left  side.  ??? You will be monitored and given oxygen.  ??? You are given sedation (relaxing) medication through an intravenous (IV) line.  ??? The healthcare provider will put the endoscope in your mouth and down your esophagus. It is thinner than most pieces of food that you swallow.??? It will not affect your breathing. The medicine helps keep you from gagging.  ??? Air is inserted to expand your GI tract. It can make you burp.  ??? During the procedure, the healthcare provider can take biopsies (tissue samples), remove abnormalities such as polyps, or treat abnormalities through a variety of devices placed through the endoscope. You will not feel this.  ??? The endoscope carries images of your upper GI tract to a video screen.  ??? An adult must drive you home.

## 2019-05-17 NOTE — Patient Instructions
Mr. Blick, I want to review what we spoke about and the plan moving forward:    Ordered Tests:   CT chest, abdomen, pelvis   Multiple blood tests            Important information:  Scheduling ([phone]) or ([phone])  Nurse [phone])  Clinic Fax [phone])    Emergencies (911)  Urgent after-hour ([phone]), ask to page on-call physician

## 2019-05-17 NOTE — Progress Notes
Date of Service: 05/17/2019    Subjective:             Jason Elliott. is a 23 y.o. male.    History of Present Illness  Patient presents to GI clinic today for chief complaint of chest pain. His chest pain started approximately in April 2020 which has prompted several trips to the ER and cardiac workup. He reports his pain starts at the bottom of his ribs in his epigastrum of his abdomen and radiates up to his neck. Occasionally he will have pain radiate to his b/l shoulders. He also reports he can have the sensation of food sticking in his chest. He notices this sensation with solids more than liquids. Both the chest pain and sticking happen nearly daily at different times of the day. Patient has not found a pattern of timing of the episodes. He also reports symptoms of chills, weakness, diaphoresis, when he has the chest pain. Initially he had nausea and vomiting when symptoms first started. This lasted 2-3 weeks and he has since not had n/v. Since April he has had a 30 lbs weight loss which he attributes to decreased appetite and not eating.  His diet consists mostly of tuna and chicken in different variations, ex) sandwhiches or plain. He does not smoke, drink alcohol, use drugs. He only takes omega-3 fish oil OTC. Denies NSAID or tetracycline use. He trailed famotidine for 2 weeks at the start of sypmtoms followed by 2 weeks of omeprazole which he self d/c. He did not notice a difference when taking the medications.     He has been seen extensively in cardiology clinic and workup has included cardiac MRI, stress test, event monitor which has revealed infrequent NSVT. Each of his multiple ED visits, he has had normal EKG and negative troponin.     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       Medical History:   Diagnosis Date   ??? Near syncope 02/28/2019     No past surgical history on file. Social History     Tobacco Use   ??? Smoking status: Never Smoker   ??? Smokeless tobacco: Never Used   Substance Use Topics   ??? Alcohol use: Not Currently     Comment: social   ??? Drug use: Never     FamHx: Noncontributory    Review of Systems   Constitutional: Positive for appetite change, chills, fatigue and unexpected weight change.   HENT: Positive for sinus pressure and trouble swallowing.    Eyes: Negative.    Respiratory: Positive for cough, choking, chest tightness, shortness of breath and wheezing.    Cardiovascular: Positive for palpitations and leg swelling.   Gastrointestinal: Positive for constipation and diarrhea.   Endocrine: Negative.    Genitourinary: Negative.    Musculoskeletal: Positive for arthralgias and myalgias.   Skin: Negative.    Allergic/Immunologic: Negative.    Neurological: Positive for dizziness, weakness, light-headedness, numbness and headaches.   Hematological: Negative.    Psychiatric/Behavioral: Positive for decreased concentration and sleep disturbance. The patient is nervous/anxious.          Objective:         ??? famotidine (PEPCID) 20 mg tablet Take one tablet by mouth twice daily. (Patient taking differently: Take 20 mg by mouth twice daily. Indications: Pt taking once per day)   ???  metoprolol tartrate (LOPRESSOR) 25 mg tablet Take one-half tablet by mouth twice daily.   ??? omeprazole DR (PRILOSEC) 20 mg capsule TAKE ONE CAPSULE BY MOUTH DAILY BEFORE BREAKFAST     Vitals:    05/17/19 0805   BP: 136/77   BP Source: Arm, Left Upper   Patient Position: Sitting   Pulse: 69   Temp: 36.7 ???C (98 ???F)   TempSrc: Oral   Weight: 65.6 kg (144 lb 9.6 oz)   Height: 170.2 cm (67.01)   PainSc: Eight     Body mass index is 22.64 kg/m???.     Physical Exam  Constitutional:       Appearance: Normal appearance.   HENT:      Head: Normocephalic and atraumatic.   Eyes:      Extraocular Movements: Extraocular movements intact.      Conjunctiva/sclera: Conjunctivae normal.   Neck: Musculoskeletal: Normal range of motion and neck supple.   Cardiovascular:      Rate and Rhythm: Normal rate and regular rhythm.      Heart sounds: Normal heart sounds.   Pulmonary:      Effort: Pulmonary effort is normal. No respiratory distress.   Abdominal:      General: Abdomen is flat. Bowel sounds are normal. There is no distension.      Palpations: Abdomen is soft. There is no mass.      Tenderness: There is no abdominal tenderness. There is no right CVA tenderness, left CVA tenderness, guarding or rebound.      Hernia: No hernia is present.   Lymphadenopathy:      Cervical: No cervical adenopathy.   Skin:     General: Skin is warm and dry.   Neurological:      Mental Status: He is alert and oriented to person, place, and time.              Assessment and Plan:  Jason Elliott is a 23 year old male with no significant PMH who presents to GI clinic for chest pain.    Dysphagia, Weight Loss, Chest pain  -Symptoms started in April 2020  -dysphagia noticed with solids more than liquids.   -30lbs weight loss since April 2020, unintentional  Ddx: esophogeal stricture, PUD, GERD, eosinophilic esophagitis,  Malignancy    PLAN  >Omeprazole 40mg  qAM, 20mg  qPM  >Schedule for EGD    Patient seen and discussed with Dr. Jonathon Jordan, MD  PGY-2 Internal Medicine    ATTESTATION    I personally performed the key portions of the E/M visit, discussed case with resident and concur with resident documentation of history, physical exam, assessment, and treatment plan unless otherwise noted.    Staff name:  Everardo All, MD Date:  05/14/2019

## 2019-05-18 ENCOUNTER — Ambulatory Visit: Admit: 2019-06-05 | Discharge: 2019-06-05 | Primary: Student in an Organized Health Care Education/Training Program

## 2019-05-18 DIAGNOSIS — K219 Gastro-esophageal reflux disease without esophagitis: Secondary | ICD-10-CM

## 2019-05-19 ENCOUNTER — Encounter: Admit: 2019-05-19 | Discharge: 2019-05-19 | Primary: Student in an Organized Health Care Education/Training Program

## 2019-05-19 ENCOUNTER — Ambulatory Visit: Admit: 2019-05-19 | Discharge: 2019-05-19 | Primary: Student in an Organized Health Care Education/Training Program

## 2019-05-19 DIAGNOSIS — R634 Abnormal weight loss: Principal | ICD-10-CM

## 2019-05-19 NOTE — Progress Notes
I performed a history and physical examination of the patient and discussed the management with the resident.  I reviewed the resident's note and agree with the documented findings and plan of care.     Jason Elliott is a 28 you M with no chronic PMHx presenting today to establish care.  He has had multiple recent ER visits with chest pain, palpitations and axiety. He has undergone extensive cardiac evaluation.  Prior to our visit today he was also evaluated by GI and started on PPI.     PMHx, family, social and surgical history reviewed and updated in the chart.    Medications reviewed    Vitals reviewed- BMI 22  Wt Readings from Last 8 Encounters:   05/17/19 65.3 kg (144 lb)   05/17/19 65.6 kg (144 lb 9.6 oz)   04/19/19 65.8 kg (145 lb)   03/17/19 71.4 kg (157 lb 6.4 oz)   03/01/19 74.8 kg (164 lb 12.8 oz)   02/23/19 79.4 kg (175 lb)   02/21/19 79.4 kg (175 lb)   ]    A/P: full assessment and plan per Dr. Samuel Jester.  Patient does present with objective 30 pound weight loss.  When further questions about diet arose patient really does eat a very healthy diet which is new over the past several months.  I do think a portion of his weight loss could be attributed to change in dietary habits. However the degree of change is rapid and he definitely perceives this as unintentional. He has other concerning symptoms of palpitations, dysphagia and night sweats which are are a concerning constellation in the setting of weight loss.  I do believe that CT C/A/P is appropriate to r/o underlying malignancy.  Additionally, patient does have some abnormalities on recent lab evaluations.  We will further evaluate these with peripheral smear and HIV testing.     RTC 1 month or PRN    Azucena Fallen, MD

## 2019-05-21 ENCOUNTER — Encounter: Admit: 2019-05-21 | Discharge: 2019-05-21 | Primary: Student in an Organized Health Care Education/Training Program

## 2019-05-21 DIAGNOSIS — R55 Syncope and collapse: Secondary | ICD-10-CM

## 2019-05-31 ENCOUNTER — Encounter: Admit: 2019-05-31 | Discharge: 2019-05-31 | Primary: Student in an Organized Health Care Education/Training Program

## 2019-05-31 DIAGNOSIS — R002 Palpitations: Secondary | ICD-10-CM

## 2019-05-31 DIAGNOSIS — R55 Syncope and collapse: Secondary | ICD-10-CM

## 2019-05-31 NOTE — Progress Notes
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  -----------------------------------------------------------------------------------  Progress???Notes???  Derrell Lolling,???[place],???MD???(Physician)???????????????Clinical???Cardiac???Electrophysiology,???Internal???Medicine???????????????03/17/19???    Date???of???Service:???03/17/2019    History???of???Present???Illness:  ???  See???Medical???History/Course???Below  Mr.???Shlok???Arnulfo???Slowey???Jr.???is???a???22???y.o.???man???referred???by???Genton,???Randall???E,???MD???for???evaluation???of???non???sustained???VT???on???event???monitor.??????No???known???cardiac???history.???He???was???recently???seen???by???my???partner???Dr.???Genton???on???02/21/2019???at???that???time???he???had???reported???feeling???skips???and???flutters???for???over???a???year.??????These???have???become???more???frequently???recently???and???the???symptoms???associated???with???these???have???become???more???severe.???Cardiac???MRI???dated???03/09/2019???showed???a???normal???ventricular???EF???at???66%,???normal???right???ventricular???size???and???function,???no???significant???valvular???normalities,???and???normal???chamber???sizes.??????There???is???no???evidence???of???inflammation???or???fibrosis???to???explain???the???patient's???ventricular???arrhythmias.??????A???dobutamine???stress???echocardiogram???dated???02/23/2019???showed???a???normal???left???ventricular???systolic???function???of???60%,???no???significant???valvular???abnormalities,???no???evidence???of???ischemia.??????There???were???no???arrhythmias???inducible???during???the???duration???of???the???study.I???reviewed???the???urgent???rhythm???strip???submitted???on???02/27/2019???and???I???am???not???convinced???that???the???episode???of???nonsustained???ventricular???tachycardia???is???real.??????This???could???be???artifact???as???there???is???some???sort???of???abrupt???termination???and???transition???prior???to???the???last???beat???of???what???is???thought???to???be???a???premature???ventricular???contraction.??????We???will???need???to???continue???to???monitor???and???see???if???this???recurs???throughout???the???duration???of???the???statin.??????His???current???cardiac???work-up???is???been???negative???to???date.??????With???nothing???to???explain???why???he???would??? be???having???nonsustained???ventricular???tachycardia.??????He???recently???presented???to???the???emergency???department???on???03/12/2019???with???symptoms???of???chest???pain???x2???weeks.??????He???had???also???reported???mild???nausea???and???vomiting,???lightheadedness,???and???palpitations???that???were???worse???after???he???had???eaten.??????Cardiac???work-up???was???negative.??????There???is???no???evidence???of???significant???arrhythmias.??????He???again???presented???to???the???emergency???department???on???03/14/2019???with???similar???complaints???to???03/12/2019.??????We???were???able???to???obtain???the???rhythm???strips???from???the???event???monitor???correlating???with???the???patient's???episode???at???0728???on???03/14/2019???where???he???reported???chest???pain???and???skipped???beats???these??????episodes???did???not???correlate???with???any???specific???arrhythmia.??????There???was???no???evidence???of???nonsustained???ventricular???tachycardia.???Additionally,???he???obtained???the???rhythm???strips???from???May???29???during???an???episode???of???skipped???beats???lightheadedness???and???heart???racing???and???this???was???associated???with???sinus???rhythm???one???isolated???premature???ventricular???contraction???and???one???isolated???premature???atrial???contraction.Marland Kitchen  ???  He???is???with???no???acute???complaints???today.??????He???is???has???dull???chest???discomfort???today.???He???felt???his???blood???pressure???was???rising,???he???was???tired,???and???he???felt???flushed???again.???Both???episodes???were???the???same???when???the???episodes???happened???on???Wednesday.??????He???reports???that???he???is???having???sharp???pain???in???the???chest???that???sharp???pain???can???last???seconds???to???all???day.??????Stress???does???play???a???big???part???of???this.???Anxiety,???has???always???been???a???problem.???He???has???also???noticed???that???with???some???foods???he???gets???more???this.???He???has???had???some???nausea,???vomiting.???Certain???foods???that???cause???his???symptoms???such???as???pizza???or???soda.??????He???has???pain???with???tuna???and???other???foods.??????The???boosts???are???something???that???he???uses???as???an???alternative???when???he???can't???eat???due???to???pain.???He???has???had???a???lot???of???dryness???in???his???throat.???Sometimes???he???has???some???swelling???where???it???is???rough???to???go???down.???He???has???had???some???issues???with???food???getting???stuck???in???his???throats.???  ???  He???does???not???report???any???significant???stressors.??????He???lives???with???his???fianc???e???an d???his???relationship???is???been???going???okay.??????His???mother???and???father???live???in[place]???and???he???has???a???close???relationship???with???them???as???well.??????He???has???a???close???relationship???with???his???brothers???with???no???specific???contacts???currently.??????He???does???report???that???he???is???not???completely???satisfied???with???his???current???job???and???has???some???issues???with???the???fact???that???he???is???not???doing???what???he???would???ultimately???like???to???be.??????He???denies???being???depressed???although???he???feels???that???he???is???frequently???anxious???and???has???always???been???like???this???all???of???his???life.??????He???denies???feeling???of???hurting???himself???or???hurting???others.  ???    Impression:  #.??????Chest???pain:???First,???I???reviewed???the???results???of???that???7???beat???run???of???nonsustained???ventricular???tachycardia???dated???02/27/2019???that???correlated???with???the???urgent???alert???from???vital.??????On???my???close???review???I???am???confident???that???this???is???not???true???ventricular???tachycardia.??????This???is???most???consistent???with???artifact???as???there???is???an???appearance???of???QRS???spikes???that???can???be???seen???throughout???the???VT???there???is???also???an???abrupt???termination???of???the???rhythm???strip???with???some???sort???of???interference.??????Ultimately???I???believe???this???was???this???was???misinterpreted.??????I???would???like???to???follow-up???on???the???final???results???of???the???monitor.???As???noted???above,???I???reviewed???the???rhythm???strips???from???the???days???that???he???presented???to???the???emergency???department???and???these???were???consistent???with???sinus???tachycardia???and???no???nonsustained???ventricular???tachycardia.??????There???was???one???premature???atrial???contraction???and???one???premature???ventricular???contraction???noted.???I???am???having???a???hard???time???correlating???the???patient's???chest???discomfort???with???a???cardiac???etiology.??????Description???of???his???symptoms???today???actually???most???consistent???with???a???GI???etiology???potentially???an???esophageal???spasm,???and???upper???epigastric???issue,???and/or???an???achalasia???potentially.??????He???has???some???occasional???food???stuck???sensation???in???his???throat???and???some???dryness???in???his???throat???that???does???cause???him???discomfort.??????He???is???also???having???some???issues???with???specific???foods???that???cause???him???to???have???more???of???the???pain???and ???discomfort.??????I???will???go???ahead???and???refer???him???to???GI???for???formal???evaluation.??????However,???I???would???like???for???him???to???establish???care???with???a???primary???care???doctor???as???he???has???several???generalized???issues???and???may???warrant???an???endocrine???evaluation???(rule???out???pheo,???carcinoid???syndrome)???and???may???need???something???for???a???mild???depression/anxiety???disorder???as???he???reports???several???symptoms???that???suggest???some???component???of???these.???  #.??????Palpitations:???He???has???not???had???any???recurrent???episodes???of???palpitations.??????The???episode???he???had???that???initially???brought???him???into???see???a???cardiologist???actually???occurred???before???the???monitor.??????He???has???not???had???a???recurrent???episode???while???he???has???been???on???the???monitor.  #.??????Anxiety:???There???is???certainly???some???component???of???anxiety???related???to???the???patient's???symptoms.??????I??????will???have???him???establish???care???with???a???primary???care???provider???for???these???issues.  #.??????Lower???extremity???edema:???Vague???and???nonspecific???symptoms???regarding???his???lower???extremity???edema.??????I???have???not???seen???any???lower???extremity???edema???during???his???office???visit.??????    Plan:  #.??????I???will???go???ahead???and???refer???him??????to???GI???to???rule???out???any???esophageal???pathology???that???might???be???explaining???the???patient's???sharp???chest???discomfort???and???his???correlation???with???specific???foods???in???diet.  #.??????We???will???go???ahead???and???start???omeprazole???20???mg???once???daily???to???see???if???this???helps.  #.??????I???would???like???him???to???establish???care???with???a???primary???care???physician???to???help???work-up???any???potential???endocrine???issues???as???noted???above???and???to???assist???with???some???potential???underlying???depression/anxiety???disorder.  #.??????I???provided???significant???reassurance???to???the???patient???regarding???his???negative???cardiac???work-up.??????I???will???await???the???results???of???the???monitor???and???will???plan???to???see???him???back???in???6???months.??????If???everything???continues???to???be???negative???at???that???point???I???will???have???him???continue???to???follow-up???with???his???primary???care???provider.    From: Laurian Brim, RN - To: Jerry Caras, MD, [place], Patty  about an hour ago    Yes.??????Ok???to???proceed.??????Thanks. From: Jerry Caras, MD - To: Laurian Brim, RN, [place], Patty  42 minutes ago

## 2019-05-31 NOTE — Pre-Anesthesia Patient Instructions
UPPER GI ENDOSCOPY   INSTRUCTIONS      Jason Elliott, Jason Elliott are scheduled for an EGD on: 06/05/2019  at: 10:45 AM   With Dr. Wende Crease, Mayra Neer, MD  You must arrive by:  9:45 AM     ** Please keep in mind that our surgery schedule is fluid, and affected by multiple factors, so it is possible that your procedure time could change.  We will make every effort to maintain your original procedure time, but there are no guarantees. Thank you for your patience and understanding!      OUR ADDRESS IS [address] [place] [address]  [address] TO [address] AND TURN LEFT.   TAKE A LEFT AT THE FIRST OR SECOND ENTRANCE AND FOLLOW SIGNAGE TO THE AMBULATORY SURGERY CENTER ([address]).    To Reschedule call: [phone]  For Questions call: [phone] or [phone]  ____________________________________________________________    Upper GI endoscopy allows healthcare providers to look directly into the beginning of your gastrointestinal(GI) tract.  The esophagus, stomach, and duodenum (first part of the small intestine) make up the upper GI tract.  _____________________________________________________________________________________________________    **Starting today, hold your Multivitamin & Fish Oil until after your procedure.**    5 DAYS PRIOR:  1. Check with your prescribing physician for instructions about stopping your blood thinner.  Examples of blood thinners are Aleve, Aspirin, Coumadin, Eliquis, Ibuprofen, Naproxen, Plavix and Xarelto.  2. Do not give yourself a Lovenox injection the morning of the test.  Lovenox injections may be taken as usual the day before the test.    DAY OF TEST:  1. You should have nothing to eat after midnight prior to your procedure.  2. You may drink clear liquids up until (4) hours before your scheduled procedure time. After this you should have nothing by mouth. This includes GUM or CANDY.   a. Chewing tobacco must be stopped (6) hours before your scheduled procedure.   b. If you have an early morning test, take ONLY your essential morning medications (heart, blood pressure, seizure, etc.) with a small sip of water.  3. Bathe, brush teeth and gargle the morning of your procedure.   4. Wear comfortable, casual, loose fitting clothing that are easy to get on and off.  5. Please bring your cell phone with you.  6. Take off any jewelry, take out any piercings, and leave all other valuables at home.  7. If you wear glasses or contacts, please bring a case for their safekeeping.   8. You will be sedated for the procedure. A responsible adult must drive you home (no Benedetto Goad, taxis or buses are allowed) and we ask that your ride stay at the facility until your procedure is finished. If you do not have a driver we will be unable to do the test.  9. You will be here for (3-4) hours from arrival time.   10. You will not be able to return to work the same day if you have received sedation.   11. Please bring a list of your current medications and dosages with you.  12. Please bring a copy of your DL and insurance card along with any required copay/deductible    ____________________________________________________________________________________________________________________    KEEPING YOU SAFE    ** THE HEALTH AND SAFETY OF OUR PATIENTS, STAFF AND PHYSICIANS IS OUR TOP PRIORITY. TO REDUCE THE RISK OF POSSIBLE COVID-19 EXPOSURE WE ARE TAKING THE PROPER PRECAUTIONS LISTED BELOW    *  YOU ARE PERMITTED 1 VISITOR IN THE WAITING AREA.    * ALL PATIENTS AND THEIR RESPONSIBLE ADULT (IF ENTERING THE FACILITY) WILL BE SCREENED ON THE DATE OF SERVICE UPON ARRIVAL AND BE REQUIRED TO WEAR A MASK WHILE INSIDE THE FACILITY.     * ALL PATIENTS MUST SELF QUARANTINE AFTER COVID-19 TESTING/SCREENING UNTIL THEIR SCHEDULED PROCEDURE.     * NOTIFY YOUR SURGEON IF YOU HAVE ANY SIGNIFICANT HEALTH STATUS CHANGES OR SHOULD YOU BECOME ILL PRIOR TO SURGERY WITH FEVER (TEMP 100.4 FAHRENHEIT OR GREATER) AND/OR COUGH, DIFFICULTY BREATHING, CHILLS, MUSCLE PAIN, HEADACHE, SORE THROAT OR NEW ONSET LOSS OF TASTE OR SMELL, OR NEW SUSPECTED EXPOSURE TO COVID-19.    ___________________________________________________________________________________    COVID-19 TESTING    You are scheduled for a Covid-19 test at the [facility] location (listed below) on  06/02/2019  at  4:30 PM     * UPON ARRIVAL, FOLLOW SIGNS AND PARK YOUR VEHICLE IN DESIGNATED AREA. PLEASE REMAIN IN YOUR VEHICLE AT ALL TIMES AND CALL PHONE NUMBER LISTED BELOW TO CHECK IN.     * ALL PATIENTS MUST SELF QUARANTINE AFTER COVID-19 TESTING/SCREENING UNTIL THEIR SCHEDULED PROCEDURE.     [facility]  [facility]   [address]   [place], [place] [address]  [phone]   Student Center  [address]  [place], [place] [address]  ([address] of [place] and [place])  [phone]         Pre-Admissions Testing (PAT)  [facility] [place]  [email or web address]  [address]   [place], [place] [address]  [phone] (PAT)  [phone] (PAT)  [email or web address]  An Affiliate of [facility]

## 2019-06-02 ENCOUNTER — Encounter: Admit: 2019-06-02 | Discharge: 2019-06-03 | Primary: Student in an Organized Health Care Education/Training Program

## 2019-06-02 DIAGNOSIS — Z1159 Encounter for screening for other viral diseases: Secondary | ICD-10-CM

## 2019-06-02 NOTE — Progress Notes
Patient arrived to [facility] for COVID-19 testing 06/02/19 1600. Patient identity confirmed via photo I.D. Nasopharyngeal procedure explained to the patient.   Nasopharyngeal swab completed left  Patient education provided given and instructed patient self isolate until contacted w/ results and further instructions. CDC handout on COVID-19 given to patient.   [email or web address]    Swab collected by Shelda Pal, CNA.    Date symptoms began/reason for testing: Pre Procedure

## 2019-06-03 LAB — COVID-19 (SARS-COV-2) PCR

## 2019-06-03 NOTE — Progress Notes
Contacted patient and confirmed name and DOB. Patient advised that COVID-19 test results are negative. Advised that patient can continue with the procedure and should follow pre-procedure instructions. Advised patient to continue with home quarantine until procedure. Advised that if they develop any concerning symptoms prior to the procedure to contact their procedure team, specialist, and/or PCP for assistance.     Elinora Weigand, RN

## 2019-06-05 ENCOUNTER — Encounter: Admit: 2019-06-05 | Discharge: 2019-06-05 | Primary: Student in an Organized Health Care Education/Training Program

## 2019-06-05 DIAGNOSIS — R55 Syncope and collapse: Secondary | ICD-10-CM

## 2019-06-05 DIAGNOSIS — R002 Palpitations: Secondary | ICD-10-CM

## 2019-06-05 MED ORDER — PROPOFOL INJ 10 MG/ML IV VIAL
0 refills | Status: DC
Start: 2019-06-05 — End: 2019-06-05

## 2019-06-05 MED ORDER — ONDANSETRON HCL (PF) 4 MG/2 ML IJ SOLN
4 mg | Freq: Once | INTRAVENOUS | 0 refills | Status: AC | PRN
Start: 2019-06-05 — End: ?

## 2019-06-05 MED ORDER — FENTANYL CITRATE (PF) 50 MCG/ML IJ SOLN
0 refills | Status: DC
Start: 2019-06-05 — End: 2019-06-05

## 2019-06-05 MED ORDER — LIDOCAINE HCL 20 MG/ML (2 %) IJ SOLN
0 refills | Status: DC
Start: 2019-06-05 — End: 2019-06-05

## 2019-06-05 MED ORDER — PROPOFOL 10 MG/ML IV EMUL 20 ML (INFUSION)(AM)(OR)
INTRAVENOUS | 0 refills | Status: DC
Start: 2019-06-05 — End: 2019-06-05

## 2019-06-05 MED ORDER — LACTATED RINGERS IV SOLP
INTRAVENOUS | 0 refills | Status: DC
Start: 2019-06-05 — End: 2019-06-10

## 2019-06-05 NOTE — Discharge Instructions - Supplementary Instructions
Discharge Instructions     PROCEDURE:      ? Colonoscopy          ? EGD          ? Upper EUS               ? Lower EUS           ? Esophageal Dilatation             ? Biopsies taken               ? Polyps removed       ? Flex Sigmoidoscopy    ? Celiac Plexus Block       After your procedure you may expect some of the following common symptoms, or perhaps none:  ? You may have a sore throat for 2-3 days after the procedure.  Sucrets or Cepastat lozenges will help the discomfort.  ? If you have any abdominal cramping or discomfort after the procedure, this can be relieved by belching or passing air.  ? A small amount of bleeding is normal if a biopsy was taken or polyps removed.    ACTIVITY:  ? Rest at home for the next 12 hours. You may then begin to resume your normal activities.  ? DO NOT drive any vehicle, operate any power tools, make any major decisions, or sign any legal documents for the next 12 hours.      ? You need someone to stay with you for 12 hours after the procedure.       MEDICATIONS:  ? Resume your normal medications and their schedule.  ? Avoid aspirin and aspirin containing products such as Anacin, Bufferin or Alka Seltzer for 1-2 days or as directed by your physician if a polypectomy has been performed.  (Tylenol is OK to use.)    DIET:  ? If no nausea or vomiting occurs, you may resume your normal diet.  ? DO NOT drink alcohol, including beer or wine for the next 12 hours.  ? High fiber diet.    IV SITE:  If IV site is painful, place a warm, wet compress on the area and repeat until the soreness is better.  If not improved in 3 days, notify your physician's office.    CONTACT YOUR DOCTOR IF YOU HAVE:  - Nausea or vomiting that lasts greater than 12 hours.  - A temperature greater than 101???F unrelieved by Tylenol.  - Severe or unusual abdominal discomfort or pain.  - Any bleeding from your mouth or bowels greater than two tablespoons and increasing.  - Increased shortness of breath. - Coughing up blood, black tarry looking stools or passage of blood.  - Difficulty swallowing.    IF YOUR DOCTOR TOOK BIOPSIES OR TISSUE SAMPLES:  - Please allow 10 days for the results to be completed. You will receive a letter in the mail with results.    If you have any concerns after the procedure call:      Dr. Brown Human patients call [phone] to speak with a nurse   Dr. Wende Crease patients call [phone] and speak with a nurse    Dr. Janene Madeira patients call [phone] and speak with a nurse   Dr. Burnis Medin patients call [phone] and speak with a nurse   Dr. Jomarie Longs patients call [phone] and speak with a nurse   Dr. Horald Pollen patients call [phone] and speak with a nurse   Dr. Wynonia Lawman patients call [phone] and speak with a  nurse   Dr. Jean Rosenthal patients call [phone] and speak with a nurse   Dr. Maryruth Bun patients call [phone] and speak with a nurse   Dr. Daphine Deutscher patients call [phone] and speak with a nurse   Dr. Francisco Capuchin patients call [phone] and speak with a nurse   Dr. Milta Deiters patients call [phone] and speak with a nurse   Dr. Allena Katz patients call [phone] and speak with a nurse   Dr. Keenan Bachelor patients call [phone] and speak with a nurse    GI Clinic [phone]    If after 5pm or weekends, contact GI Fellow on call through the [place] operator [phone]      We hope that your visit was a pleasant one! Please complete the emailed patient satisfaction survey at your convenience. We appreciate your comments regarding your care. If you have questions for the [facility] please call [phone].

## 2019-06-05 NOTE — Anesthesia Pre-Procedure Evaluation
Anesthesia Pre-Procedure Evaluation    Name: Jason Elliott.      MRN: [id number]     DOB: 30-May-1996     Age: 23 y.o.     Sex: male   _________________________________________________________________________     Procedure Info:   Procedure Information     Date/Time:  06/05/19 1045    Procedure:  ESOPHAGOGASTRODUODENOSCOPY WITH SPECIMEN COLLECTION BY BRUSHING/ WASHING (N/A )    Location:  [facility] / [facility]    Surgeon:  Everardo All, MD          Physical Assessment  Vital Signs (last filed in past 24 hours):  BP: 127/79 (08/24 1014)  Temp: 36.6 ???C (97.9 ???F) (08/24 1014)  Pulse: 73 (08/24 1014)  Respirations: 14 PER MINUTE (08/24 1014)  SpO2: 100 % (08/24 1014)  Height: 170.2 cm (67) (08/24 1014)  Weight: 63.2 kg (139 lb 6.4 oz) (08/24 1014)      Patient History   No Known Allergies     Current Medications    Medication Directions   docosahexaenoic acid/epa (FISH OIL PO) Take  by mouth.   famotidine (PEPCID) 20 mg tablet Take one tablet by mouth twice daily.  Patient taking differently: Take 20 mg by mouth twice daily. Indications: Pt taking once per day   MULTIVITAMIN PO Take  by mouth.         Review of Systems/Medical History      Patient summary reviewed  Pertinent labs reviewed    PONV Screening: Non-smoker  No history of anesthetic complications  No family history of anesthetic complications      Airway - negative        Pulmonary - negative          Cardiovascular       Recent diagnostic studies:          echocardiogram and stress test          Interpretation Summary     Left ventricular systolic function is within normal limits.  LVEF 60%  No significant valvular abnormalities.  No pericardial effusion.   ???  ???  Nonischemic dobutamine stress echocardiogram and electrocardiogram.   ???  Low risk for cardiovascular complications          Exercise tolerance: >4 METS      Beta Blocker therapy: No      Beta blockers within 24 hours: n/a      Dysrhythmias (occassional PVC, PAC) Recent work up for chest pain, palpitations, and near syncope. Cardiac workup including event monitor and stress echo without significant abnormality.       GI/Hepatic/Renal - negative        Neuro/Psych - negative        Musculoskeletal - negative        Endocrine/Other - negative      Constitution - negative   Physical Exam    Airway Findings      Mallampati: II      TM distance: >3 FB      Neck ROM: full      Mouth opening: good    Dental Findings:             Cardiovascular Findings:       Rhythm: regular      Rate: normal    Pulmonary Findings:       Breath sounds clear to auscultation.       Diagnostic Tests  Hematology:   Lab Results  Component Value Date    HGB 15.3 04/19/2019    HCT 43.7 04/19/2019    PLTCT 201 04/19/2019    WBC 3.9 04/19/2019    NEUT 53 04/19/2019    ANC 2.06 04/19/2019    ALC 1.41 04/19/2019    MONA 7 04/19/2019    AMC 0.28 04/19/2019    EOSA 3 04/19/2019    ABC 0.04 04/19/2019    MCV 91.5 04/19/2019    MCH 31.9 04/19/2019    MCHC 34.9 04/19/2019    MPV 8.2 04/19/2019    RDW 12.7 04/19/2019         General Chemistry:   Lab Results   Component Value Date    NA 141 04/19/2019    K 4.0 04/19/2019    CL 105 04/19/2019    CO2 26 04/19/2019    GAP 10 04/19/2019    BUN 18 04/19/2019    CR 1.07 04/19/2019    GLU 77 04/19/2019    CA 9.6 04/19/2019    ALBUMIN 4.8 04/19/2019    MG 2.1 03/01/2019    TOTBILI 0.8 04/19/2019      Coagulation: No results found for: PT, PTT, INR      Anesthesia Plan    ASA score: 1   Plan: MAC  Induction method: intravenous  NPO status: acceptable      Informed Consent  Anesthetic plan and risks discussed with patient.        Plan discussed with: CRNA.

## 2019-06-05 NOTE — H&P (View-Only)
Pre Procedure History and Physical/Sedation Plan    Name:Jason Elliott.                                                                   MRN: [id number]                 DOB:02/13/1996          Age: 23 y.o.  Date of Service: 06/05/2019    Date of Procedure:  06/05/2019    Planned Procedure(s):  GI:  EGD and possible dilation  Sedation/Medication Plan: MAC (Monitored Anesthesia Care)  Discussion/Reviews:  Physician has discussed risks and alternatives of this type of sedation and above planned procedures with patient  ___________________________________________________________________  Chief Complaint:  Dysphagia    History of Present Illness: Jason Elliott. is a 23 y.o. male here for dysphagia.    Previous Anesthetic/Sedation History:  n/a    Medical History:   Diagnosis Date   ??? Near syncope 02/28/2019   ??? Palpitations      History reviewed. No pertinent surgical history.  Pertinent medical/surgical history reviewed  Pertinent family history reviewed  Social History     Tobacco Use   ??? Smoking status: Never Smoker   ??? Smokeless tobacco: Never Used   Substance Use Topics   ??? Alcohol use: Not Currently     Comment: social   ??? Drug use: Never     Social History     Substance and Sexual Activity   Drug Use Never     Allergies:  Patient has no known allergies.  Medications  Current Outpatient Medications   Medication Sig   ??? docosahexaenoic acid/epa (FISH OIL PO) Take  by mouth.   ??? famotidine (PEPCID) 20 mg tablet Take one tablet by mouth twice daily. (Patient taking differently: Take 20 mg by mouth twice daily. Indications: Pt taking once per day)   ??? MULTIVITAMIN PO Take  by mouth.     Current Facility-Administered Medications   Medication   ??? lactated ringers infusion     Review of Systems:  A 14 point review of systems was negative except for: that discussed in HPI           Physical Exam:  Temp: 36.6 ???C (97.9 ???F) (08/24 1014)  Pulse: 73 (08/24 1014)  Respirations: 14 PER MINUTE (08/24 1014) BP: 127/79 (08/24 1014)  General appearance: alert and cooperative  Throat: Lips, mucosa, and tongue normal. Teeth and gums normal  Lungs: normal respiratory rate  Heart: regular rate and rhythm  Abdomen: soft  Extremities: extremities normal, atraumatic, no cyanosis or edema  @  Airway:  airway assessment performed  Mallampati I (soft palate, uvula, fauces, tonsillar pillars visible)  Anesthesia Classification:  ASA I (A normal healthy patient)  NPO Status: Acceptable  Pregnancy Status: Not Pregnant    Lab/Radiology/Other Diagnostic Tests  Labs:  No pertinent labs      Everardo All, MD  Pager

## 2019-06-05 NOTE — Anesthesia Post-Procedure Evaluation
Post-Anesthesia Evaluation    Name: Jason Elliott.      MRN: [id number]     DOB: Nov 01, 1995     Age: 23 y.o.     Sex: male   __________________________________________________________________________     Procedure Information     Anesthesia Start Date/Time:  06/05/19 1109    Procedures:       ESOPHAGOGASTRODUODENOSCOPY WITH SPECIMEN COLLECTION BY BRUSHING/ WASHING (N/A )      ESOPHAGOGASTRODUODENOSCOPY WITH BIOPSY - FLEXIBLE (N/A )    Location:  [facility] / [place] OR    Surgeon:  Kerman Passey, MD          Post-Anesthesia Vitals  BP: 117/81 (08/24 1200)  Temp: 36.6 C (97.9 F) (08/24 1200)  Pulse: 59 (08/24 1200)  Respirations: 12 PER MINUTE (08/24 1200)  SpO2: 100 % (08/24 1200)  SpO2 Pulse: 60 (08/24 1200)  Height: 170.2 cm (67") (08/24 1014)   Vitals Value Taken Time   BP 117/81 06/05/2019 12:00 PM   Temp 36.6 C (97.9 F) 06/05/2019 12:00 PM   Pulse 59 06/05/2019 12:00 PM   Respirations 12 PER MINUTE 06/05/2019 12:00 PM   SpO2 100 % 06/05/2019 12:00 PM         Post Anesthesia Evaluation Note    Evaluation location: pre/post  Patient participation: recovered; patient participated in evaluation  Level of consciousness: alert  Pain management: adequate    Hydration: normovolemia  Temperature: 36.0C - 38.4C  Airway patency: adequate    Perioperative Events       Post-op nausea and vomiting: no PONV    Postoperative Status  Cardiovascular status: hemodynamically stable  Respiratory status: spontaneous ventilation        Perioperative Events  Perioperative Event: No  Emergency Case Activation: No

## 2019-06-06 ENCOUNTER — Encounter: Admit: 2019-06-06 | Discharge: 2019-06-06 | Primary: Student in an Organized Health Care Education/Training Program

## 2019-06-06 DIAGNOSIS — R002 Palpitations: Secondary | ICD-10-CM

## 2019-06-06 DIAGNOSIS — R55 Syncope and collapse: Secondary | ICD-10-CM

## 2019-06-09 ENCOUNTER — Encounter: Admit: 2019-06-09 | Discharge: 2019-06-09 | Primary: Student in an Organized Health Care Education/Training Program

## 2019-06-09 NOTE — Telephone Encounter
Received communication from [facility] staff that patient's father came to front ask advising they have attempted to contact our office w/o return call, pt is complaining of headache.    Review of chart and phone log: no calls received by patient/family member to RN line x [phone].    Attempted to contact patient, LVM requesting return call.

## 2019-06-12 ENCOUNTER — Ambulatory Visit: Admit: 2019-06-12 | Discharge: 2019-06-13 | Primary: Student in an Organized Health Care Education/Training Program

## 2019-06-12 ENCOUNTER — Encounter: Admit: 2019-06-12 | Discharge: 2019-06-12 | Primary: Student in an Organized Health Care Education/Training Program

## 2019-06-12 DIAGNOSIS — R1310 Dysphagia, unspecified: Secondary | ICD-10-CM

## 2019-06-12 DIAGNOSIS — F419 Anxiety disorder, unspecified: Secondary | ICD-10-CM

## 2019-06-12 DIAGNOSIS — R634 Abnormal weight loss: Secondary | ICD-10-CM

## 2019-06-12 DIAGNOSIS — R0789 Other chest pain: Secondary | ICD-10-CM

## 2019-06-12 DIAGNOSIS — R55 Syncope and collapse: Secondary | ICD-10-CM

## 2019-06-12 DIAGNOSIS — R002 Palpitations: Secondary | ICD-10-CM

## 2019-06-12 MED ORDER — SERTRALINE 25 MG PO TAB
25 mg | ORAL_TABLET | Freq: Every day | ORAL | 0 refills | Status: AC
Start: 2019-06-12 — End: ?

## 2019-06-12 NOTE — Progress Notes
Date of Service: 06/12/2019    Jason Elliott. is a 23 y.o. male. DOB: 1996/01/13   MRN#: [id number]    History of Present Illness     Syncope + Chest Pain Episode:  Right before dinner last night, he felt tightness that started in his left arm and worked up to his chest. When he opened his jaw, he felt pins and needles pain up his left jaw/temple, he stood up and felt lightheaded. His fiance caught him before he hit the floor. His extremities felt cold.These episodes started about 5 months ago. He first felt palpitations, felt his heart stop, felt cold, and became unconscious   Has worn heart event monitors for a few months. Extensive cardiac workup has been negative. He feels that when his heart is beating fast, or when he feels a surge of adrenaline, he will have one beat that feels like a bubble comes up from the heart, then it will flutter for a while and then settle down. He feels like it is hard to keep up with walking. Has a hard time sleeping at night, he feels scared, and his chest pain will get so severe.    Weight Loss:   He saw his PCP less than one month ago for weight loss. He has not yet had labs completed that were ordered last visit for workup. He continues to report weight loss, unintentional. Reports that in April his weight was in the 180's. He feels weak, and feels it is increasingly difficult to keep up at work. 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    His father is in the office today and is very tearful and worried. He is frustrated that his son keeps being told that he is a mystery, and that no one has given the, any answers regarding this chest pain.     Per Chart Review:   Patient recently underwent EGD 06/05/19 for dysphagia, findings and biopsy normal  PMHx of palpitations, chest pain, nonsustained v-tach: follows with cardiology -cardiac MRI 03/09/2019 with EF 65%   -evaluated by cardiac electrophysiologist who is not convinced the episode of NS VT was real, cardiac workup negative, thought to  be artifact    -anxiety thought to play a large role    -correlation with diet and GI issues thought to play a role, referred to GI  05/17/2019: most recent PCP visit, patient presented with weight loss and night sweats   -CT CAP unremarkable   -CRP, ESR, CBC w/ diff, Hep C, HIV, peripheral smear all ordered, not yet completed       Review of Systems   Constitution: Positive for weight loss. Negative for chills, decreased appetite and fever.   HENT: Negative for congestion.    Eyes: Negative for blurred vision.   Cardiovascular: Positive for chest pain, leg swelling, palpitations and syncope.   Respiratory: Negative for cough.    Skin: Negative for rash.   Musculoskeletal: Negative for joint pain.   Gastrointestinal: Negative for abdominal pain, change in bowel habit, nausea and vomiting.   Psychiatric/Behavioral: Negative for depression and suicidal ideas. The patient has insomnia and is nervous/anxious.      Objective:     ??? docosahexaenoic acid/epa (FISH OIL PO) Take  by mouth.   ??? MULTIVITAMIN PO Take  by mouth.   ??? sertraline (ZOLOFT) 25 mg tablet  Take one tablet by mouth daily. Indications: repeated episodes of anxiety     Vitals:    06/12/19 1431   BP: 138/82   Pulse: 64   Resp: 16   Temp: 36.7 ???C (98 ???F)   TempSrc: Oral   Weight: 64.4 kg (142 lb)   Height: 170.2 cm (67)   PainSc: Ten     Body mass index is 22.24 kg/m???.     Physical Exam  Vitals signs reviewed.   Constitutional:       Appearance: Normal appearance.   HENT:      Head: Normocephalic and atraumatic.      Right Ear: Tympanic membrane and ear canal normal.      Left Ear: Tympanic membrane and ear canal normal.      Nose: Nose normal.      Mouth/Throat:      Mouth: Mucous membranes are moist.      Pharynx: Oropharynx is clear.   Neck:      Musculoskeletal: Normal range of motion. Cardiovascular:      Rate and Rhythm: Normal rate and regular rhythm.      Pulses: Normal pulses.      Heart sounds: Normal heart sounds.   Pulmonary:      Effort: Pulmonary effort is normal.      Breath sounds: Normal breath sounds.   Abdominal:      General: Abdomen is flat. Bowel sounds are normal.      Palpations: Abdomen is soft.   Musculoskeletal: Normal range of motion.   Skin:     General: Skin is warm and dry.   Neurological:      General: No focal deficit present.      Mental Status: He is alert and oriented to person, place, and time.          Assessment and Plan:    Problem   Weight Loss   Dysphagia   Non-Cardiac Chest Pain   Anxiety       Anxiety  - patient experiencing anxiety, panic attack symptoms related to his chest pain and the fear he has of the chest pain   Plan:   > prescribed 25 mg Sertraline today   > patient has follow up in one month with PCP     Non-cardiac chest pain  - Reviewed previous testing and notes by Cardiology & Electrophysioloy  - physical exam normal   Plan:   > continue to monitor symptoms   > treat anxiety in hopes that it is related to chest pain symptoms     Weight loss  - patient continues to report weight loss   Plan:   > treat anxiety in hopes that his is related   > encouraged him to complete labs ordered by Dr. Caprice Kluver at last visit     Dysphagia  - reviewed EGD note, biospy results   Plan:   > encourage patient to attend follow up appointment with Dr. Wende Crease    Patient has a variety of symptoms, most of which are unexplained at this point. We discussed anxiety and how this could be contributing to a large portion of symptoms and are hopeful that an anti-anxiety medication will help. However, we will continue to monitor his symptoms and pursue further testing if needed for an organic cause.     Melford Aase, MD  Internal Medicine Resident, PGY-1  Available on [facility]  Pager [phone]

## 2019-06-12 NOTE — Assessment & Plan Note
-   patient experiencing anxiety, panic attack symptoms related to his chest pain and the fear he has of the chest pain   Plan:   > prescribed 25 mg Sertraline today   > patient has follow up in one month with PCP

## 2019-06-12 NOTE — Assessment & Plan Note
-   Reviewed previous testing and notes by Cardiology & Electrophysioloy  - physical exam normal   Plan:   > continue to monitor symptoms   > treat anxiety in hopes that it is related to chest pain symptoms

## 2019-06-12 NOTE — Progress Notes
I spoke with the patient and his father and discussed the management with the resident.  I reviewed the resident's note and agree with the documented findings and plan of care.     Jason Elliott has a number of symptoms that include chest pain, jaw pain (separate) numbness and tingling, weight loss, dysphagia.  He had extensive GI and cardiac work up, with no clear etiology.    We talked about anxiety at length today.  He states that he gets a physical symptom (such as chest pain) then he gets very anxious.  He is under a lot of stress at work.  His Dad is very anxious about his son's health as well and would like to consider admission to the hospital for further evaluation and treatment.     My exam today is normal.     Discussed options and decided to start treatment for anxiety, have patient monitor his symptoms closely, then reassess in one month. Will decide about further testing at the next appointment.

## 2019-06-12 NOTE — Assessment & Plan Note
-   patient continues to report weight loss   Plan:   > treat anxiety in hopes that his is related   > encouraged him to complete labs ordered by Dr. Samuel Jester at last visit

## 2019-06-12 NOTE — Assessment & Plan Note
-   reviewed EGD note, biospy results   Plan:   > encourage patient to attend follow up appointment with Dr. Renea Ee

## 2019-07-30 ENCOUNTER — Encounter
Admit: 2019-07-30 | Discharge: 2019-07-30 | Payer: BC Managed Care – PPO | Primary: Student in an Organized Health Care Education/Training Program

## 2019-08-21 ENCOUNTER — Ambulatory Visit
Admit: 2019-08-21 | Discharge: 2019-08-21 | Payer: BC Managed Care – PPO | Primary: Student in an Organized Health Care Education/Training Program

## 2019-08-21 ENCOUNTER — Encounter
Admit: 2019-08-21 | Discharge: 2019-08-21 | Payer: BC Managed Care – PPO | Primary: Student in an Organized Health Care Education/Training Program

## 2019-08-21 DIAGNOSIS — M545 Low back pain: Secondary | ICD-10-CM

## 2019-08-21 DIAGNOSIS — R55 Syncope and collapse: Secondary | ICD-10-CM

## 2019-08-21 DIAGNOSIS — R002 Palpitations: Secondary | ICD-10-CM

## 2019-08-21 MED ORDER — CYCLOBENZAPRINE 10 MG PO TAB
10 mg | ORAL_TABLET | Freq: Every day | ORAL | 0 refills | 21.00000 days | Status: AC | PRN
Start: 2019-08-21 — End: ?

## 2019-08-21 NOTE — Patient Instructions
Take ibuprofen 600 mf 3 times a day as needed.  Cyclobenzaprine 10 mg at bedtime for continued pain  Try [facility] or [facility].

## 2021-07-08 ENCOUNTER — Ambulatory Visit
Admit: 2021-07-08 | Discharge: 2021-07-08 | Payer: BC Managed Care – PPO | Primary: Student in an Organized Health Care Education/Training Program

## 2021-07-08 ENCOUNTER — Encounter
Admit: 2021-07-08 | Discharge: 2021-07-08 | Payer: BC Managed Care – PPO | Primary: Student in an Organized Health Care Education/Training Program

## 2021-07-08 DIAGNOSIS — R55 Syncope and collapse: Secondary | ICD-10-CM

## 2021-07-08 DIAGNOSIS — R002 Palpitations: Secondary | ICD-10-CM

## 2021-07-08 DIAGNOSIS — F419 Anxiety disorder, unspecified: Secondary | ICD-10-CM

## 2021-07-08 DIAGNOSIS — Z0000 Encounter for general adult medical examination without abnormal findings: Secondary | ICD-10-CM

## 2021-07-08 LAB — CBC
HEMATOCRIT: 49 % (ref 40–50)
HEMOGLOBIN: 16 g/dL — ABNORMAL HIGH (ref 13.5–16.5)
MCH: 32 pg (ref 26–34)
MCHC: 34 g/dL (ref 32.0–36.0)
MCV: 94 FL (ref 80–100)
MPV: 7.5 FL (ref 7–11)
PLATELET COUNT: 228 K/UL (ref 150–400)
RBC COUNT: 5.1 M/UL (ref <100)
RDW: 12 % (ref 11–15)
WBC COUNT: 4.6 K/UL (ref >40)

## 2021-07-08 LAB — HIV 1& 2 AG-AB SCRN W REFLEX HIV 1 PCR QUANT

## 2021-07-08 LAB — LIPID PROFILE
CHOLESTEROL: 184 mg/dL (ref <200)
TRIGLYCERIDES: 70 mg/dL (ref <150)

## 2021-07-08 LAB — HEPATITIS C ANTIBODY W REFLEX HCV PCR QUANT

## 2021-07-08 NOTE — Patient Instructions
Jason Elliott, it was a distinct pleasure to see you today. I want to review what we spoke about and the plan moving forward:    Medications:  Please continue your current medications    Ordered Tests:  CBC  Lipid profile  HIV, hepatitis C        Important information:  Scheduling [phone]) or [phone])  Nurse ([phone])  Clinic Fax [phone])    Emergencies ([phone])  Urgent after-hour [phone]), ask to page on-call physician

## 2021-07-08 NOTE — Progress Notes
General Medicine Clinic      SUBJECTIVE      History of Present Illness:  Jason Elliott. is a 25 y.o. male with PMH of palpitations, anxiety and dysphagia who presents to clinic for general follow-up.    Reports that he has overall been in good health since his previous visit. He was concerned about weight loss and has now gained 20lbs in 2 years. He lifts weights ~6x per week and eats 4-5 meals per day of lean meat (typically chicken or ground [place]) and rice. Notes resolution of previous dysphagia. He previously took prilosec and is no longer doing so. Denies dysphagia w/ solids or liquids. Denies heartburn.     Continues to have palpitations. Symptoms vary in frequency from multiple times per day to multiple days in a row w/o symptoms. Denies associated dizziness, light-headedness, diaphoresis, near-syncope, chest pain. Denies association of symptoms w/ caffeine intake, dehydration, over-exertion. Prior cardiac workup was negative and symptoms thought to be associated w/ anxiety. He previously took sertraline for anxiety and stopped ~1 yr ago as he felt that he was able to control his symptoms well without medications. His palpitations are most frequent while lying in bed in the morning and evening and resolve w/ deep breathing and relaxation.    Review of Systems   Constitutional: Positive for weight gain. Negative for chills, diaphoresis, fever, malaise/fatigue and night sweats.   Eyes: Negative for blurred vision.   Cardiovascular: Positive for palpitations. Negative for chest pain, dyspnea on exertion, leg swelling and near-syncope.   Respiratory: Negative for cough and shortness of breath.    Hematologic/Lymphatic: Negative for adenopathy. Does not bruise/bleed easily.   Skin: Negative for color change.   Musculoskeletal: Negative for arthritis and myalgias.   Gastrointestinal: Negative for abdominal pain, change in bowel habit, dysphagia, heartburn, nausea and vomiting.   Genitourinary: Negative for dysuria.   Neurological: Negative for headaches, light-headedness, seizures and weakness.   Psychiatric/Behavioral: Negative for altered mental status, depression and substance abuse.         Medical History:   Diagnosis Date   ? Near syncope 02/28/2019   ? Palpitations        Surgical History:   Procedure Laterality Date   ? ESOPHAGOGASTRODUODENOSCOPY WITH SPECIMEN COLLECTION BY BRUSHING/ WASHING N/A 06/05/2019    Performed by Everardo All, MD at [facility] OR   ? ESOPHAGOGASTRODUODENOSCOPY WITH BIOPSY - FLEXIBLE N/A 06/05/2019    Performed by Everardo All, MD at [facility] OR       Family History   Problem Relation Age of Onset   ? Seizures Mother    ? Seizures Father    ? Glaucoma Paternal Uncle    ? Cancer Paternal Grandfather         Leukemia   ? Glaucoma Paternal Grandfather        Social History     Socioeconomic History   ? Marital status: Single   Tobacco Use   ? Smoking status: Never Smoker   ? Smokeless tobacco: Never Used   Vaping Use   ? Vaping Use: Never used   Substance and Sexual Activity   ? Alcohol use: Not Currently     Comment: social   ? Drug use: Never       No Known Allergies      Medications:  ? docosahexaenoic acid/epa (FISH OIL PO) Take  by mouth.   ? MULTIVITAMIN PO Take  by mouth.  OBJECTIVE  Vitals:    07/08/21 0842   BP: 133/68   Pulse: 66   SpO2: 100%   PainSc: Zero   Weight: 73.6 kg (162 lb 3.2 oz)   Height: 175.3 cm (5' 9)   Body mass index is 23.95 kg/m?Marland Kitchen      Physical Exam  Constitutional:       Appearance: Normal appearance.   HENT:      Head: Normocephalic and atraumatic.      Mouth/Throat:      Mouth: Mucous membranes are moist.      Pharynx: Oropharynx is clear.   Eyes:      Extraocular Movements: Extraocular movements intact.      Conjunctiva/sclera: Conjunctivae normal.      Pupils: Pupils are equal, round, and reactive to light.   Cardiovascular:      Rate and Rhythm: Normal rate and regular rhythm.      Pulses: Normal pulses.      Heart sounds: Normal heart sounds. No murmur heard.  Pulmonary:      Effort: Pulmonary effort is normal. No respiratory distress.      Breath sounds: Normal breath sounds. No wheezing.   Abdominal:      General: Bowel sounds are normal. There is no distension.      Palpations: Abdomen is soft.      Tenderness: There is no abdominal tenderness.   Musculoskeletal:      Right lower leg: No edema.      Left lower leg: No edema.   Skin:     General: Skin is warm and dry.   Neurological:      Mental Status: He is alert and oriented to person, place, and time.   Psychiatric:         Mood and Affect: Mood normal.         Behavior: Behavior normal.         Laboratory:  No visits with results within 30 Day(s) from this visit.   Latest known visit with results is:   Hospital Outpatient Visit on 06/05/2019   Component Date Value   ? PATHOLOGY REPORT 06/05/2019                      Value:[facility]  [email or web address]    Department of Pathology and Laboratory Medicine  [address], [place], [place] [address]  Surgical Pathology Office:  [phone]  Fax:  [phone]  SURGICAL PATHOLOGY REPORT    NAME: SYERE, GOLDNER. SURG PATH #: [id number] MR #: [id number] SPECIMEN CLASS:  SI BILLING #: [id number] ALT ID #:  LOCATION: [facility] DATE OF PROCEDURE:  06/05/2019 AGE:  23 SEX: M DATE RECEIVED: 06/05/2019 DOB: 1996/09/17  TIME  RECEIVED:  11:36 PHYSICIAN: Erick Alley MED DATE OF REPORT: 06/06/2019 COPY  TO:  DATE OF PRINTING: 06/06/2019         ########################################################################  Final Diagnosis:    A. Squamous mucosa, proximal esophageal biopsy r/o eoe, biopsy:  No diagnostic abnormalities.        Attestation:  By this signature, I attest that I have personally formulated the final  interpretation expressed in this report and that the above diagnosis is  based upon my examination of the slides and/or other material                           indicated in  this report.    +++Electronically Signed  Out By Sol Blazing, MD on 06/06/2019+++             ksw/06/05/2019             ########################################################################  Material Received:  A: proximal esophageal biopsy r/o eoe    History:  25 year old male with clinical history of gastroesophageal reflux disease,  esophagitis.  A. Rule out EOE.        Gross Description:  A. Received in formalin labeled proximal esophagus biopsy, r/o eoe is a  0.3 x 0.3 x 0.2 cm aggregate of tan-brown soft tissue fragments. The  specimen is entirely submitted in cassette A1.  (dlk)                  ? Provation Report 06/05/2019                      Value:[facility]  Patient Name: Brien Wilkinson  Procedure Date: 06/05/2019 10:54 AM  MRN: [id number]  CSN: [id number]  Case ID: [id number]      Date of Birth: 1995/12/05  Gender: Male  Attending Physician: Erick Alley , MD  Procedure:                    Upper GI endoscopy  Indications:                  Dysphagia  Providers:                    Erick Alley, MD (Doctor), Pricilla Riffle                                 (Nurse), Lorriane Shire, Technician (Technician)  Referring Physician:          Vonita Moss (Referring MD)  Medications:                  Monitored Anesthesia Care  Complications:                No immediate complications.  Procedure:       Pre-Anesthesia Assessment:       - Prior to the procedure, a History and Physical was performed, and        patient medications and allergies were reviewed. The patient's tolerance        of previous anesthesia was also reviewed. The risks and benefits of the        procedure and the sedation options and risks were discussed with                           the        patient. All questions were answered, and informed consent was obtained.        Prior Anticoagulants: The patient has taken no previous anticoagulant or        antiplatelet agents. ASA Grade Assessment: I - A normal, healthy        patient. After reviewing the risks and benefits, the patient was deemed        in satisfactory condition to undergo the procedure.       After obtaining informed consent, the endoscope was passed under direct        vision. Throughout the procedure, the patient's blood pressure, pulse,        and oxygen saturations were monitored continuously. The was  introduced        through the mouth, and advanced to the second part of duodenum. The        upper GI endoscopy was accomplished without difficulty. The patient        tolerated the procedure well.  Findings:       The Z-line was regular and was found 40 cm from the incisors.       The examined esophagus was normal. Biopsies were taken with a cold        forceps for histology.       The entire exami                          ned stomach was normal.       The duodenal bulb, first portion of the duodenum and second portion of        the duodenum were normal.       The exam was otherwise without abnormality.  Impression:                   - Z-line regular, 40 cm from the incisors.                                - Normal esophagus. Biopsied.                                - Normal stomach.                                - Normal duodenal bulb, first portion of the                                 duodenum and second portion of the duodenum.                                - The examination was otherwise normal.  Estimated Blood Loss:         Estimated blood loss: none.  Recommendation:               - Patient has a contact number available for                                 emergencies. The signs and symptoms of                                 potential delayed complications were discussed                                 with the patient. Return to normal activities                                 tomorrow. Wri                          tten discharge  instructions were                                 provided to the patient.                                - Resume previous diet.                                - Continue present medications.                                - Await pathology results.                                - Give four week trial of once daily dosed                                 omeprazole. Take 20mg  orall, and dose thirty                                 minutes before your first meal of the day. If                                 this helps, continue. If it does not, please                                 contact our office so we can schedule                                 appropriate motility studies of the esophagus.  Scope In: 11:17:18 AM  Scope Out: 11:21:33 AM  Total Procedure Duration Time 0 hours 4 minutes 15 seconds   Procedure Code(s):            --- Professional ---                                [phone], Esophagogastroduodenoscopy, flexible,                                 transoral;                           with biopsy, single or multiple  Diagnosis Code(s):            --- Professional ---                                R13.10, Dysphagia, unspecified  CPT copyright 2019 [employer] Association. All rights reserved.  The codes documented in this report are preliminary and upon coder review may   be revised to meet current compliance  requirements.  Attending Participation:       I personally performed the entire procedure.  Erick Alley, MD  Erick Alley, MD  06/05/2019 11:26:29 AM  The attending physician has electronically signed and finalized this document.  Number of Addenda: 0  Note Initiated On: 06/05/2019 10:54 AM           ASSESSMENT & PLAN  Joemar Kellar. is a 25 y.o. male with PMH of palpitations, anxiety and dysphagia who presents to clinic for general follow-up.    Anxiety, improved  Palpitations  - hx of anxiety, panic attack symptoms related to his chest pain and the fear he has of the chest pain   - no longer taking sertraline and believes symptoms are well controlled w/o medication  Plan:   > cont to monitor symptoms  ?  Hx of non-cardiac chest pain  - Reviewed previous testing and notes by Cardiology & EP  - Previous cardiac MRI and dobutamine stress echo w/o abnormality  - denies further chest pain  Plan:   > continue to monitor symptoms   ?  Weight loss, improved  Leukopenia  - weight increase from 142lbs in 2020 to 162lbs in 2022  - CT chest/AP in 2020 w/o abnormality   Plan:   > cont to treat anxiety as above  > repeat CBC as below  ?  Dysphagia, resolved  - has followed w/ Dr. Wende Crease in GI clinic  - unremarkable EGD on 06/05/19  - denies further symptoms, no longer taking prilosec  Plan:   > cont to monitor symptoms    Health maintenance  > HIV, hep C screen ordered  > lipid panel, CBC ordered  > COVID vaccine declined  > comprehensive physical exam completed today     Problem   Weight Loss (Resolved)   Dysphagia (Resolved)   Unintentional weight loss & night sweats (Resolved)    Over 3 months lost 30 lbs, possible dietary influence but possibly not enough to explain, possible psychiatric influence (h.o. anxiety, somatic symptoms/pain)  Associated fatigue, daily night sweats  Note persistent leukopenia and dysphagia (recent visit with GE)  Note monospot neg    CBC, smear, ESR-CRP, CT CAP     Non-Cardiac Chest Pain (Resolved)   Near Syncope (Resolved)                Patient Instructions   Mr. Bencosme, it was a distinct pleasure to see you today. I want to review what we spoke about and the plan moving forward:    Medications:  ? Please continue your current medications    Ordered Tests:  ? CBC  ? Lipid profile  ? HIV, hepatitis C        Important information:  Scheduling ([phone]) or [phone])  Nurse [phone])  Clinic Fax [phone])    Emergencies ([phone])  Urgent after-hour [phone]), ask to page on-call physician        Return in about 1 year (around 07/08/2022) for In-Person.

## 2021-08-22 ENCOUNTER — Encounter: Admit: 2021-08-22 | Discharge: 2021-08-22 | Primary: Student in an Organized Health Care Education/Training Program

## 2022-01-07 ENCOUNTER — Encounter: Admit: 2022-01-07 | Discharge: 2022-01-07 | Primary: Student in an Organized Health Care Education/Training Program

## 2022-01-07 ENCOUNTER — Ambulatory Visit: Admit: 2022-01-07 | Discharge: 2022-01-07 | Primary: Student in an Organized Health Care Education/Training Program

## 2022-01-07 DIAGNOSIS — R002 Palpitations: Secondary | ICD-10-CM

## 2022-01-07 DIAGNOSIS — R6882 Decreased libido: Secondary | ICD-10-CM

## 2022-01-07 DIAGNOSIS — R55 Syncope and collapse: Secondary | ICD-10-CM

## 2022-01-07 DIAGNOSIS — D751 Secondary polycythemia: Secondary | ICD-10-CM

## 2022-01-07 DIAGNOSIS — R5383 Other fatigue: Secondary | ICD-10-CM

## 2022-01-07 NOTE — Progress Notes
Internal Medicine Clinic Progress Note             Name:  Jason Elliott.   Medical Record Number: [id number]          Date Of Birth:  [birth year]            Age: 26 y.o. male  Today's Date:  01/07/2022    Subjective     History of present illness: 26 y.o. male with PMH of anxiety and dysphagia who presents to clinic for follow up labs.    Patient overall feels well today.  He presents to the clinic today to ask about follow-up labs and discuss testosterone therapy.  The patient has no past medical history with the exception of anxiety that is improving and her dysphagia that was resolved.  He is overall healthy and exercises 4- 5 days a week.  He maintains a healthy diet with lean meat, vegetables, and rice.  He is currently in between jobs.    The patient wanted to discuss testosterone therapy.  He has been reporting fatigue recently and decreased libido without erectile dysfunction.  He denies any changes in sleep, bowel movements, vision change, headaches, dizziness/lightheadedness, or mood changes.  Discussed with the patient that we will obtain a morning testosterone level and proceed according to the testosterone levels.  Patient was agreeable.  He will follow-up with his PCP as needed.  Discussed with the patient that we will obtain CBC to follow-up on leukocytosis, CMP since his last labs were in 2020, B12 levels for fatigue, and morning cortisol and testosterone levels.    Review of Systems:   14 pt ROS was performed and pertinent for as in HPI      History: Medical, Surgical, Social, Family      Medical History:   Diagnosis Date   ? Near syncope 02/28/2019   ? Palpitations        Surgical History:   Procedure Laterality Date   ? ESOPHAGOGASTRODUODENOSCOPY WITH SPECIMEN COLLECTION BY BRUSHING/ WASHING N/A 06/05/2019    Performed by Everardo All, MD at [facility] OR   ? ESOPHAGOGASTRODUODENOSCOPY WITH BIOPSY - FLEXIBLE N/A 06/05/2019    Performed by Everardo All, MD at [facility] OR       Social History Socioeconomic History   ? Marital status: Single   Tobacco Use   ? Smoking status: Never   ? Smokeless tobacco: Never   Vaping Use   ? Vaping Use: Never used   Substance and Sexual Activity   ? Alcohol use: Not Currently     Comment: social   ? Drug use: Never       Family History   Problem Relation Age of Onset   ? Seizures Mother    ? Seizures Father    ? Glaucoma Paternal Uncle    ? Cancer Paternal Grandfather         Leukemia   ? Glaucoma Paternal Grandfather        No Known Allergies    Medications:   ? docosahexaenoic acid/epa (FISH OIL PO) Take  by mouth.   ? MULTIVITAMIN PO Take  by mouth.       Objective     There were no vitals filed for this visit.There is no height or weight on file to calculate BMI.    Physical Exam  Constitutional:       Appearance: Normal appearance.   HENT:      Head: Normocephalic and atraumatic.  Mouth/Throat:      Mouth: Mucous membranes are moist.   Eyes:      General: No scleral icterus.  Cardiovascular:      Rate and Rhythm: Normal rate and regular rhythm.   Pulmonary:      Effort: Pulmonary effort is normal.      Breath sounds: Normal breath sounds.   Abdominal:      General: Abdomen is flat.      Palpations: Abdomen is soft.   Musculoskeletal:      Right lower leg: No edema.      Left lower leg: No edema.   Skin:     General: Skin is dry.   Neurological:      General: No focal deficit present.      Mental Status: He is alert and oriented to person, place, and time. Mental status is at baseline.         Laboratory:  CBC with differential    Lab Results   Component Value Date/Time    WBC 4.6 07/08/2021 09:40 AM    RBC 5.17 07/08/2021 09:40 AM    HGB 16.8 (H) 07/08/2021 09:40 AM    HCT 49.0 07/08/2021 09:40 AM    MCV 94.9 07/08/2021 09:40 AM    MCH 32.5 07/08/2021 09:40 AM    MCHC 34.2 07/08/2021 09:40 AM    RDW 12.5 07/08/2021 09:40 AM    PLTCT 228 07/08/2021 09:40 AM    MPV 7.5 07/08/2021 09:40 AM    Lab Results   Component Value Date/Time    NEUT 53 04/19/2019 01:46 PM    ANC 2.06 04/19/2019 01:46 PM    LYMA 36 04/19/2019 01:46 PM    ALC 1.41 04/19/2019 01:46 PM    MONA 7 04/19/2019 01:46 PM    AMC 0.28 04/19/2019 01:46 PM    EOSA 3 04/19/2019 01:46 PM    AEC 0.11 04/19/2019 01:46 PM    BASA 1 04/19/2019 01:46 PM    ABC 0.04 04/19/2019 01:46 PM        Comprehensive Metabolic Profile    Lab Results   Component Value Date/Time    NA 141 04/19/2019 01:46 PM    K 4.0 04/19/2019 01:46 PM    CL 105 04/19/2019 01:46 PM    CO2 26 04/19/2019 01:46 PM    GAP 10 04/19/2019 01:46 PM    BUN 18 04/19/2019 01:46 PM    CR 1.07 04/19/2019 01:46 PM    GLU 77 04/19/2019 01:46 PM    Lab Results   Component Value Date/Time    CA 9.6 04/19/2019 01:46 PM    ALBUMIN 4.8 04/19/2019 01:46 PM    TOTPROT 7.5 04/19/2019 01:46 PM    ALKPHOS 61 04/19/2019 01:46 PM    AST 14 04/19/2019 01:46 PM    ALT 11 04/19/2019 01:46 PM    TOTBILI 0.8 04/19/2019 01:46 PM    GFR >60 04/19/2019 01:46 PM    GFRAA >60 04/19/2019 01:46 PM        Thyroid Studies    Lab Results   Component Value Date/Time    TSH 2.05 04/19/2019 01:46 PM    No results found for: Edsel Petrin, THYBINDGLB         Assessment and Plan:     Jason Elliott. is a 26 y.o. male with PMH of palpitations, anxiety and dysphagia who presents to clinic for general follow-up.  ?    Fatigue  Decreased libido   > TSH  >  AM Testosterone  > CBC  > CMP   > Vitamin B12  > AM cortisol       Anxiety, improved  - hx of anxiety, panic attack symptoms related to his chest pain and the fear he has of the chest pain   - no longer taking sertraline and believes symptoms are well controlled w/o medication  Plan:   >?cont to monitor symptoms  ?  ?  Dysphagia, resolved  - has followed w/ Dr. Wende Crease in GI clinic  - unremarkable EGD on 06/05/19  - denies further symptoms, no longer taking prilosec  Plan:   > cont to monitor symptoms  ?      ----------------------------------------------------------------------------------------------------------------------------------------------  Historical Resolved Issues:    Health Maintenance and Immunization History      There is no immunization history on file for this patient.  Health Maintenance Due   Topic Date Due   ? INFLUENZA VACCINE (1) Never done   ? DTAP/TDAP VACCINES (8 - Td or Tdap) 10/12/2021   ? DEPRESSION SCREENING  10/12/2021       ----------------------------------------------------------------------------------------------------------------------------------------------    Seen and discussed with Dr. Conley Rolls, MD       01/07/2022  Internal Medicine, PGY1    A significant portion of this note was dictated via voice recognition software.    Every attempt was made to edit. Small errors may persist.   Please contact note writer if clarification is needed.      _____________________________________________________________________________________    There are no diagnoses linked to this encounter.    There are no Patient Instructions on file for this visit.   _____________________________________________________________________________________

## 2022-04-20 ENCOUNTER — Encounter: Admit: 2022-04-20 | Discharge: 2022-04-20 | Primary: Student in an Organized Health Care Education/Training Program

## 2022-07-21 ENCOUNTER — Encounter: Admit: 2022-07-21 | Discharge: 2022-07-21 | Primary: Student in an Organized Health Care Education/Training Program

## 2022-08-31 ENCOUNTER — Encounter: Admit: 2022-08-31 | Discharge: 2022-08-31 | Primary: Student in an Organized Health Care Education/Training Program

## 2022-09-01 ENCOUNTER — Encounter: Admit: 2022-09-01 | Discharge: 2022-09-01 | Primary: Student in an Organized Health Care Education/Training Program

## 2022-09-01 ENCOUNTER — Ambulatory Visit: Admit: 2022-09-01 | Discharge: 2022-09-02 | Primary: Student in an Organized Health Care Education/Training Program

## 2022-09-01 DIAGNOSIS — R002 Palpitations: Secondary | ICD-10-CM

## 2022-09-01 DIAGNOSIS — Z0000 Encounter for general adult medical examination without abnormal findings: Secondary | ICD-10-CM

## 2022-09-01 DIAGNOSIS — R55 Syncope and collapse: Secondary | ICD-10-CM

## 2022-09-01 DIAGNOSIS — I10 Essential (primary) hypertension: Secondary | ICD-10-CM

## 2022-09-01 NOTE — Progress Notes
Internal Medicine Clinic      SUBJECTIVE      History of Present Illness:  Jason Prey. is a 26 y.o. male with PMH of palpitations, anxiety and dysphagia who presents to clinic for general follow-up.    His major concern today is a sharp, nonradiating, right upper quadrant abdominal pain with associated nausea and vomiting.  Reports that this began about 2 months ago and has been associated with heavy lifting and fatty and acidic foods.  He denies any association with a time of day, however his pain is not present until he undertakes heavy lifting.  He previously worked for [employer] and started working for a moving company about 6 months ago.  States that he is lifting much heavier objects with his new job.  He cut his finger in a work accident last Friday requiring stitches and since he has been out of work notes that his symptoms have entirely resolved.    His blood pressure is elevated again today at 141/84.  He does not measure his blood pressure at home and has not been on antihypertensive therapy in the past.  Weight remains stable from his previous visit.  His typical diet includes a breakfast burrito for breakfast, lunch varies, and dinner typically includes red meat.  He remains active at work by working for a moving company and goes to [employer] about 3 times per week.  Recently he has been drinking 2 glasses of wine in the evenings.    He continues to have palpitations for a few seconds a couple of times per day.  Prior cardiac workup including cardiac MRI and stress test was negative.  He denies associated chest pain shortness of breath with these palpitations.  Denies family history of sudden cardiac death.  These palpitations were previously attributed to his anxiety, in particular anxiety surrounding an underlying cardiac etiology.  States that his anxiety is well-controlled at the moment and he is appreciative to have his girlfriend to talk with.  He denies depressed mood.  Denies SI/HI. Denies difficulty with sleep.    He denies any further symptoms of dysphagia.    Review of Systems   All other systems reviewed and are negative.      No Known Allergies      Medications:  ? cephalexin (KEFLEX) 500 mg capsule Take one capsule by mouth four times daily.   ? docosahexaenoic acid/epa (FISH OIL PO) Take  by mouth.   ? MULTIVITAMIN PO Take  by mouth.           OBJECTIVE  Vitals:    09/01/22 0847   BP: (!) 141/84   BP Source: Arm, Left Upper   Pulse: 67   Temp: 36.8 ?C (98.2 ?F)   Resp: 16   SpO2: 98%   TempSrc: Oral   PainSc: Zero   Weight: 80.7 kg (178 lb)   Height: 170.2 cm (5' 7)   Body mass index is 27.88 kg/m?Marland Kitchen      Physical Exam  Constitutional:       General: He is not in acute distress.  HENT:      Head: Normocephalic and atraumatic.      Mouth/Throat:      Mouth: Mucous membranes are moist.      Pharynx: Oropharynx is clear.   Eyes:      Extraocular Movements: Extraocular movements intact.      Conjunctiva/sclera: Conjunctivae normal.      Pupils: Pupils are equal, round, and  reactive to light.   Cardiovascular:      Rate and Rhythm: Normal rate and regular rhythm.      Heart sounds: Normal heart sounds. No murmur heard.  Pulmonary:      Effort: Pulmonary effort is normal. No respiratory distress.      Breath sounds: Normal breath sounds.   Abdominal:      General: Bowel sounds are normal. There is no distension.      Palpations: Abdomen is soft. There is no mass.      Tenderness: There is no abdominal tenderness.      Hernia: No hernia is present.   Musculoskeletal:      Right lower leg: No edema.      Left lower leg: No edema.   Skin:     General: Skin is warm and dry.   Neurological:      Mental Status: He is alert and oriented to person, place, and time.   Psychiatric:         Mood and Affect: Mood normal.         Behavior: Behavior normal.         Laboratory:  No visits with results within 30 Day(s) from this visit.   Latest known visit with results is:   Hospital Outpatient Visit on 07/08/2021   Component Date Value   ? Anti HCV 07/08/2021 NONREACTIVE    ? HIV 1 and 2 AG AB Screen 07/08/2021 NONREACTIVE    ? Cholesterol 07/08/2021 184    ? Triglycerides 07/08/2021 70    ? HDL 07/08/2021 81    ? LDL 07/08/2021 85    ? VLDL 07/08/2021 14    ? Non HDL Cholesterol 07/08/2021 103    ? White Blood Cells 07/08/2021 4.6    ? RBC 07/08/2021 5.17    ? Hemoglobin 07/08/2021 16.8 (H)    ? Hematocrit 07/08/2021 49.0    ? MCV 07/08/2021 94.9    ? [facility] 07/08/2021 32.5    ? MCHC 07/08/2021 34.2    ? RDW 07/08/2021 12.5    ? Platelet Count 07/08/2021 228    ? MPV 07/08/2021 7.5            ASSESSMENT & PLAN  Jason Klemens Deandra Abegglen. is a 26 y.o. male with PMH of palpitations, anxiety and dysphagia who presents to clinic for general follow-up.    Hypertension  - BP 141/84, 147/70 at previous appointment  -Drinks about 2 glasses of wine per night  -Eats fast food about twice a week  -Breakfast typically includes a breakfast burrito, lunch varies, typically eats red meat for dinner  Plan:  > Repeat visit in 1 month   > would initiate therapy w/ losartan  > educated on lifestyle modification  > instructed to measure BP at home      Right upper quadrant pain  Nausea  Vomiting  -Symptoms associated with heavy lifting, fatty and acidic foods  -Denies history of hernia, denies history of GERD  -Denies attempted alleviating factors other than avoiding lifting which resulted in improvement  -Etiology related to straining +/- choledocholithiasis +/- GERD  Plan:  > Continue to monitor symptoms with alteration of diet and minimizing heavy lifting      Anxiety, improved  Palpitations  - hx of anxiety, panic attack symptoms related to his chest pain and the fear he has of the chest pain   - no longer taking sertraline and believes symptoms are well controlled w/o medication  Plan:   >?  cont to monitor symptoms  ?    Hx of non-cardiac chest pain  - Reviewed previous testing and notes by Cardiology & EP  - Previous cardiac MRI and dobutamine stress echo w/o abnormality  -Continues to have mild, dull, centralized chest pain in the morning  -Denies association with physical activity  Plan:   >?continue to monitor symptoms   ?    Leukopenia, resolved  - CT chest/AP in 2020 w/o abnormality   Plan:   >?cont to treat anxiety as above    ?  Dysphagia, resolved  - has followed w/ Dr. Wende Crease in GI clinic  - unremarkable EGD on 06/05/19  - denies further symptoms, no longer taking prilosec  Plan:   > cont to monitor symptoms  ?  Health maintenance  > Tdap, COVID and influenza vaccinations declined today  > comprehensive physical exam completed today                   Patient Instructions   Jason Elliott, it was a distinct pleasure to see you today. I want to review what we spoke about and the plan moving forward:    Medications:  ? We can discuss blood pressure medication at your next visit    Ordered Tests:  ? CBC  ? CMP    Referrals:  ? None    Other:  ? Please measure your blood pressure at home and record your readings  ? Please avoid fast food and foods high in salt  ? Attempt to avoid alcohol intake        Important information:  Scheduling [phone]) or ([phone])  Nurse [phone])  Clinic Fax [phone])    Emergencies ([phone])  Urgent after-hour [phone]), ask to page on-call physician        Return in about 4 weeks (around 09/29/2022) for In-Person.

## 2022-09-01 NOTE — Patient Instructions
Jason Elliott, it was a distinct pleasure to see you today. I want to review what we spoke about and the plan moving forward:    Medications:  We can discuss blood pressure medication at your next visit    Ordered Tests:  CBC  CMP    Referrals:  None    Other:  Please measure your blood pressure at home and record your readings  Please avoid fast food and foods high in salt  Attempt to avoid alcohol intake        Important information:  Scheduling ([phone]) or ([phone])  Nurse ([phone])  Clinic Fax [phone])    Emergencies ([phone])  Urgent after-hour [phone]), ask to page on-call physician

## 2022-10-14 ENCOUNTER — Encounter: Admit: 2022-10-14 | Discharge: 2022-10-14 | Primary: Student in an Organized Health Care Education/Training Program

## 2023-06-16 ENCOUNTER — Encounter: Admit: 2023-06-16 | Discharge: 2023-06-16
